# Patient Record
Sex: Female | Born: 1995 | Race: White | Hispanic: No | Marital: Single | Smoking: Never smoker
Health system: Southern US, Community
[De-identification: ages and names within clinical notes are randomized; demographics above are authoritative.]

## PROBLEM LIST (undated history)

## (undated) DIAGNOSIS — N83209 Unspecified ovarian cyst, unspecified side: Secondary | ICD-10-CM

## (undated) HISTORY — PX: APPENDECTOMY: SHX54

## (undated) HISTORY — DX: Unspecified ovarian cyst, unspecified side: N83.209

---
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
## 2013-11-20 ENCOUNTER — Encounter: Payer: Self-pay | Admitting: General Practice

## 2013-11-20 ENCOUNTER — Other Ambulatory Visit: Payer: Self-pay | Admitting: General Practice

## 2013-11-20 ENCOUNTER — Ambulatory Visit (INDEPENDENT_AMBULATORY_CARE_PROVIDER_SITE_OTHER): Payer: BC Managed Care – PPO | Admitting: General Practice

## 2013-11-20 VITALS — BP 110/68 | HR 92 | Temp 97.8°F | Ht 67.0 in | Wt 168.0 lb

## 2013-11-20 DIAGNOSIS — J0100 Acute maxillary sinusitis, unspecified: Secondary | ICD-10-CM

## 2013-11-20 MED ORDER — AZITHROMYCIN 250 MG PO TABS: ORAL_TABLET | ORAL | Status: DC

## 2013-11-20 NOTE — Progress Notes (Signed)
   Subjective:    Patient ID: Valerie Baldwin, female    DOB: 08/13/1996, 18 y.o.   MRN: [id number]  Sinusitis This is a new problem. The current episode started in the past 7 days. The problem is unchanged. There has been no fever. Associated symptoms include congestion, coughing and sinus pressure. Pertinent negatives include no chills, shortness of breath or sore throat. Past treatments include oral decongestants. The treatment provided mild relief.      Review of Systems  Constitutional: Negative for fever and chills.  HENT: Positive for congestion, postnasal drip and sinus pressure. Negative for sore throat.   Respiratory: Positive for cough. Negative for chest tightness and shortness of breath.   Cardiovascular: Negative for chest pain and palpitations.       Objective:   Physical Exam  Constitutional: She is oriented to person, place, and time. She appears well-developed and well-nourished.  HENT:  Head: Normocephalic and atraumatic.  Right Ear: External ear normal.  Left Ear: External ear normal.  Nose: Right sinus exhibits maxillary sinus tenderness. Left sinus exhibits maxillary sinus tenderness.  Mouth/Throat: Posterior oropharyngeal erythema present.  Cardiovascular: Normal rate, regular rhythm and normal heart sounds.   Pulmonary/Chest: Effort normal and breath sounds normal. No respiratory distress. She exhibits no tenderness.  Neurological: She is alert and oriented to person, place, and time.  Skin: Skin is warm and dry.  Psychiatric: She has a normal mood and affect.          Assessment & Plan:  1. Sinusitis, acute maxillary - azithromycin (ZITHROMAX) 250 MG tablet; Take as directed  Dispense: 6 tablet; Refill: 0 -increase fluids -RTO if symptoms worsen or unresolved Patient verbalized understanding Coralie KeensMae E. Avyn Coate, FNP-C

## 2013-11-20 NOTE — Patient Instructions (Signed)

## 2014-05-15 ENCOUNTER — Ambulatory Visit (INDEPENDENT_AMBULATORY_CARE_PROVIDER_SITE_OTHER): Payer: BC Managed Care – PPO | Admitting: Nurse Practitioner

## 2014-05-15 ENCOUNTER — Encounter: Payer: Self-pay | Admitting: Nurse Practitioner

## 2014-05-15 VITALS — BP 111/82 | HR 79 | Temp 98.2°F | Ht 67.0 in | Wt 175.2 lb

## 2014-05-15 DIAGNOSIS — B029 Zoster without complications: Secondary | ICD-10-CM

## 2014-05-15 MED ORDER — VALACYCLOVIR HCL 1 G PO TABS: 1000.0000 mg | ORAL_TABLET | Freq: Three times a day (TID) | ORAL | Status: DC

## 2014-05-15 NOTE — Patient Instructions (Signed)

## 2014-05-15 NOTE — Progress Notes (Signed)
   Subjective:    Patient ID: Valerie Baldwin, female    DOB: 07/10/1996, 18 y.o.   MRN: [id number]  HPI Patient is here with patchy vesicular lesions of the right posterior ankle. She denies tenderness and itching. She noticed them yesterday. She has applied neosporin ointment.    Review of Systems  Constitutional: Negative.   HENT: Negative.   Eyes: Negative.   Respiratory: Negative.   Cardiovascular: Negative.   Gastrointestinal: Negative.   Endocrine: Negative.   Genitourinary: Negative.   Neurological: Negative.   Hematological: Negative.        Objective:   Physical Exam  Constitutional: She is oriented to person, place, and time. She appears well-developed and well-nourished.  HENT:  Head: Normocephalic.  Right Ear: Hearing, tympanic membrane, external ear and ear canal normal.  Left Ear: Hearing, tympanic membrane, external ear and ear canal normal.  Nose: Nose normal.  Mouth/Throat: Uvula is midline, oropharynx is clear and moist and mucous membranes are normal.  Eyes: Conjunctivae and EOM are normal. Pupils are equal, round, and reactive to light.  Neck: Normal range of motion. Neck supple. No JVD present. No thyromegaly present.  Cardiovascular: Normal rate, normal heart sounds and intact distal pulses.   No murmur heard. Pulmonary/Chest: Effort normal and breath sounds normal. She has no wheezes. She has no rales.  Abdominal: Soft. Bowel sounds are normal. She exhibits no mass.  Musculoskeletal: Normal range of motion.  Neurological: She is alert and oriented to person, place, and time. She has normal reflexes.  Skin: Skin is warm and dry.  Patchy vesicular lesion on right posterior ankle.   Psychiatric: She has a normal mood and affect. Her behavior is normal. Judgment and thought content normal.     BP 111/82  Pulse 79  Temp(Src) 98.2 F (36.8 C) (Oral)  Ht 5\' 7"  (1.702 m)  Wt 175 lb 3.2 oz (79.47 kg)  BMI 27.43 kg/m2      Assessment & Plan:   1.  Shingles rash  - valACYclovir (VALTREX) 1000 MG tablet; Take 1 tablet (1,000 mg total) by mouth 3 (three) times daily.  Dispense: 21 tablet; Refill: 0  Cool compress Avoid scratching Motrin or tylenol for pain  RTO prn   Mary-Margaret Daphine DeutscherMartin, FNP

## 2014-05-20 ENCOUNTER — Telehealth: Payer: Self-pay | Admitting: Nurse Practitioner

## 2014-05-20 NOTE — Telephone Encounter (Signed)
Patient aware.

## 2014-05-20 NOTE — Telephone Encounter (Signed)
If it is on the other side then it is probably nit shingles- I woud just watch for now and see if goes away- Not sure what it could be from.

## 2014-05-22 ENCOUNTER — Ambulatory Visit (INDEPENDENT_AMBULATORY_CARE_PROVIDER_SITE_OTHER): Payer: BC Managed Care – PPO | Admitting: Podiatry

## 2014-05-22 ENCOUNTER — Encounter: Payer: Self-pay | Admitting: Podiatry

## 2014-05-22 VITALS — BP 98/61 | HR 87 | Resp 16 | Ht 67.0 in | Wt 170.0 lb

## 2014-05-22 DIAGNOSIS — L600 Ingrowing nail: Secondary | ICD-10-CM

## 2014-05-22 DIAGNOSIS — L03039 Cellulitis of unspecified toe: Secondary | ICD-10-CM

## 2014-05-22 DIAGNOSIS — L03032 Cellulitis of left toe: Secondary | ICD-10-CM

## 2014-05-22 MED ORDER — CEPHALEXIN 500 MG PO CAPS: 500.0000 mg | ORAL_CAPSULE | Freq: Three times a day (TID) | ORAL | Status: DC

## 2014-05-22 MED ORDER — NEOMYCIN-POLYMYXIN-HC 1 % OT SOLN: OTIC | Status: DC

## 2014-05-22 NOTE — Progress Notes (Signed)
   Subjective:    Patient ID: Valerie Baldwin, female    DOB: 09/15/1996, 18 y.o.   MRN: [id number]  HPI Comments: "I have an ingrown toenail"  Patient c/o tender 1st toe left, both borders, for few weeks. The area is red and swollen, some drainage. She has been soaking, but not improved that much.  Toe Pain       Review of Systems  Skin: Positive for rash.  All other systems reviewed and are negative.      Objective:   Physical Exam: I have reviewed her past medical history medications allergies surgeries social history and review of systems. Pulses are strongly palpable. Neurologic sensorium is intact per since once the monofilament. Deep tendon reflexes are intact bilateral muscle strength is 5 over 5 dorsiflexors plantar flexors inverters everters all intrinsic musculature is intact. Orthopedic evaluation demonstrates all joints distal to the ankle a full range of motion without crepitation. Cutaneous evaluation demonstrates supple well hydrated cutis with exception of sharp incurvated nail margin along the tibial border of the hallux left. This doesn't demonstrate gross granulation tissue malodor. Months. She also has what appears to be a violaceous vesicular eruption to the posterior aspect of the right foot and the anterior aspect of the left foot. This does appear to be an atopic dermatitis.        Assessment & Plan:  Assessment: Ingrown toenail tibial border hallux left. Atopic dermatitis bilateral.  Plan: Discussed etiology pathology conservative versus surgical therapies. Chemical matrixectomy was performed today after local anesthetic was injected about the left hallux. She tolerated procedure well and I will followup with her in one week. She was dispensed both oral and written home-going instructions for the care of and soaking of her toes she understands this and is amenable to it.

## 2014-05-22 NOTE — Patient Instructions (Signed)

## 2014-06-03 ENCOUNTER — Encounter: Payer: Self-pay | Admitting: Podiatry

## 2014-06-03 ENCOUNTER — Ambulatory Visit (INDEPENDENT_AMBULATORY_CARE_PROVIDER_SITE_OTHER): Payer: BC Managed Care – PPO | Admitting: Podiatry

## 2014-06-03 VITALS — BP 106/64 | HR 80 | Resp 16

## 2014-06-03 DIAGNOSIS — L600 Ingrowing nail: Secondary | ICD-10-CM

## 2014-06-03 NOTE — Progress Notes (Signed)
She presents today one week status post matrixectomy hallux left. She denies fever chills nausea vomiting muscle aches and pains. Continues to soak twice daily in Betadine and warm water. She continues to Cortisporin Otic drops regularly.  Objective: Vital signs are stable she is alert and oriented x3 there is no erythema edema cellulitis drainage or odor to the hallux left. The matrixectomy site appears to be healing quite nicely.  Assessment: Well-healing matrixectomy hallux left.  Plan: Discontinue Betadine start with Epsom salts warm water soaks continue Cortisporin Otic twice daily cover during the day and leave open at night. Continue to soak and to completely resolved.

## 2014-07-08 ENCOUNTER — Ambulatory Visit (INDEPENDENT_AMBULATORY_CARE_PROVIDER_SITE_OTHER): Payer: BC Managed Care – PPO | Admitting: Podiatry

## 2014-07-08 ENCOUNTER — Encounter: Payer: Self-pay | Admitting: Podiatry

## 2014-07-08 VITALS — BP 118/60 | HR 87 | Resp 12

## 2014-07-08 DIAGNOSIS — L600 Ingrowing nail: Secondary | ICD-10-CM

## 2014-07-08 MED ORDER — NEOMYCIN-POLYMYXIN-HC 1 % OT SOLN: OTIC | Status: DC

## 2014-07-08 NOTE — Progress Notes (Signed)
She presents today with a chief complaint of painful ingrown toenails to the fibular border of the hallux bilaterally. The right foot is a new ingrown toenail in the left foot is one that we have previously worked on the never did heal completely. She denies fever chills nausea vomiting muscle aches pain has tried soaking to no avail.  Objective: Vital signs are stable she is alert and oriented x3. A sharp incurvated nail margins along the fibular border of the hallux right it appears that she has a small spicule to the hallux left. Both have developed. Months cellulitis and malodor.  Assessment: Paronychia ingrown nail abscess hallux bilateral fibular border.  Plan: Discussed etiology pathology conservative versus surgical therapies. Chemical matrixectomy performed to the fibular border of the hallux bilaterally. She tolerated this procedure well after local anesthetic was administered. I will followup with her in one week. She was given both oral and written home-going instructions for the care of her feet.

## 2014-07-15 ENCOUNTER — Ambulatory Visit: Payer: BC Managed Care – PPO | Admitting: Podiatry

## 2015-03-09 ENCOUNTER — Ambulatory Visit: Payer: Self-pay | Admitting: Physician Assistant

## 2015-07-10 ENCOUNTER — Emergency Department (HOSPITAL_BASED_OUTPATIENT_CLINIC_OR_DEPARTMENT_OTHER)
Admission: EM | Admit: 2015-07-10 | Discharge: 2015-07-11 | Disposition: A | Discharge status: Home / Self Care | Payer: BLUE CROSS/BLUE SHIELD | Attending: Emergency Medicine | Admitting: Emergency Medicine

## 2015-07-10 ENCOUNTER — Encounter (HOSPITAL_BASED_OUTPATIENT_CLINIC_OR_DEPARTMENT_OTHER): Payer: Self-pay

## 2015-07-10 DIAGNOSIS — N8320 Unspecified ovarian cysts: Secondary | ICD-10-CM | POA: Diagnosis not present

## 2015-07-10 DIAGNOSIS — R102 Pelvic and perineal pain: Secondary | ICD-10-CM

## 2015-07-10 DIAGNOSIS — R1031 Right lower quadrant pain: Secondary | ICD-10-CM | POA: Diagnosis present

## 2015-07-10 LAB — PREGNANCY, URINE: Preg Test, Ur: NEGATIVE

## 2015-07-10 LAB — URINALYSIS, ROUTINE W REFLEX MICROSCOPIC
BILIRUBIN URINE: NEGATIVE
GLUCOSE, UA: NEGATIVE mg/dL
HGB URINE DIPSTICK: NEGATIVE
Ketones, ur: NEGATIVE mg/dL
Leukocytes, UA: NEGATIVE
NITRITE: NEGATIVE
PH: 8 (ref 5.0–8.0)
Protein, ur: NEGATIVE mg/dL
SPECIFIC GRAVITY, URINE: 1.018 (ref 1.005–1.030)
Urobilinogen, UA: 0.2 mg/dL (ref 0.0–1.0)

## 2015-07-10 NOTE — ED Notes (Addendum)
Pt c/o lower abdominal cramping that radiates to the right since 2030.  She denies dysuria, denies vaginal discharge, no n/v/d, no appendix either.

## 2015-07-11 ENCOUNTER — Ambulatory Visit (HOSPITAL_BASED_OUTPATIENT_CLINIC_OR_DEPARTMENT_OTHER)
Admission: RE | Admit: 2015-07-11 | Discharge: 2015-07-11 | Disposition: A | Discharge status: Home / Self Care | Payer: BLUE CROSS/BLUE SHIELD | Source: Ambulatory Visit | Attending: Emergency Medicine | Admitting: Emergency Medicine

## 2015-07-11 ENCOUNTER — Emergency Department (HOSPITAL_BASED_OUTPATIENT_CLINIC_OR_DEPARTMENT_OTHER)
Admission: EM | Admit: 2015-07-11 | Discharge: 2015-07-11 | Disposition: A | Discharge status: Home / Self Care | Payer: BLUE CROSS/BLUE SHIELD | Source: Home / Self Care | Attending: Emergency Medicine | Admitting: Emergency Medicine

## 2015-07-11 ENCOUNTER — Encounter (HOSPITAL_BASED_OUTPATIENT_CLINIC_OR_DEPARTMENT_OTHER): Payer: Self-pay | Admitting: Emergency Medicine

## 2015-07-11 DIAGNOSIS — N83209 Unspecified ovarian cyst, unspecified side: Secondary | ICD-10-CM

## 2015-07-11 DIAGNOSIS — R1031 Right lower quadrant pain: Secondary | ICD-10-CM

## 2015-07-11 DIAGNOSIS — R102 Pelvic and perineal pain: Secondary | ICD-10-CM

## 2015-07-11 DIAGNOSIS — R935 Abnormal findings on diagnostic imaging of other abdominal regions, including retroperitoneum: Secondary | ICD-10-CM

## 2015-07-11 LAB — WET PREP, GENITAL
CLUE CELLS WET PREP: NONE SEEN
Trich, Wet Prep: NONE SEEN
Yeast Wet Prep HPF POC: NONE SEEN

## 2015-07-11 LAB — CBC WITH DIFFERENTIAL/PLATELET
BASOS ABS: 0 10*3/uL (ref 0.0–0.1)
BASOS PCT: 0 %
EOS PCT: 3 %
Eosinophils Absolute: 0.3 10*3/uL (ref 0.0–0.7)
HEMATOCRIT: 43 % (ref 36.0–46.0)
Hemoglobin: 14.2 g/dL (ref 12.0–15.0)
Lymphocytes Relative: 25 %
Lymphs Abs: 2.6 10*3/uL (ref 0.7–4.0)
MCH: 30.7 pg (ref 26.0–34.0)
MCHC: 33 g/dL (ref 30.0–36.0)
MCV: 93.1 fL (ref 78.0–100.0)
MONO ABS: 0.8 10*3/uL (ref 0.1–1.0)
Monocytes Relative: 8 %
NEUTROS ABS: 6.5 10*3/uL (ref 1.7–7.7)
Neutrophils Relative %: 64 %
PLATELETS: 268 10*3/uL (ref 150–400)
RBC: 4.62 MIL/uL (ref 3.87–5.11)
RDW: 12.7 % (ref 11.5–15.5)
WBC: 10.2 10*3/uL (ref 4.0–10.5)

## 2015-07-11 MED ORDER — HYDROCODONE-ACETAMINOPHEN 5-325 MG PO TABS: 1.0000 | ORAL_TABLET | Freq: Four times a day (QID) | ORAL | Status: DC | PRN

## 2015-07-11 MED ORDER — HYDROCODONE-ACETAMINOPHEN 5-325 MG PO TABS
1.0000 | ORAL_TABLET | Freq: Once | ORAL | Status: AC
  Administered 2015-07-11: 1 via ORAL
  Filled 2015-07-11: qty 1

## 2015-07-11 MED ORDER — ONDANSETRON 8 MG PO TBDP
8.0000 mg | ORAL_TABLET | Freq: Once | ORAL | Status: AC
  Administered 2015-07-11: 8 mg via ORAL
  Filled 2015-07-11: qty 1

## 2015-07-11 NOTE — ED Provider Notes (Signed)
CSN: [id number]     Arrival date & time 07/10/15  2224 History   First MD Initiated Contact with Patient 07/11/15 0111     Chief Complaint  Patient presents with  . Abdominal Pain     (Consider location/radiation/quality/duration/timing/severity/associated sxs/prior Treatment) HPI  This is a 19 year old female who developed right pelvic pain during intercourse about 8 PM yesterday evening. She was unable to complete intercourse due to the pain. She rates the pain as a 5 out of 10 while lying still. It is worse with movement or palpation. There is been no associated fever, chills, vomiting, diarrhea, vaginal bleeding, vaginal discharge, dysuria or hematuria. She has had nausea which she attributes to pain. The pain is described as dull and crampy. It is present constantly but waxes and wanes. She is status post appendectomy.  History reviewed. No pertinent past medical history. Past Surgical History  Procedure Laterality Date  . Appendectomy     No family history on file. Social History  Substance Use Topics  . Smoking status: Never Smoker   . Smokeless tobacco: None  . Alcohol Use: No   OB History    No data available     Review of Systems  All other systems reviewed and are negative.   Allergies  Review of patient's allergies indicates no known allergies.  Home Medications   Prior to Admission medications   Medication Sig Start Date End Date Taking? Authorizing Provider  cephALEXin (KEFLEX) 500 MG capsule Take 1 capsule (500 mg total) by mouth 3 (three) times daily. 05/22/14   Max T Hyatt, DPM  NEOMYCIN-POLYMYXIN-HYDROCORTISONE (CORTISPORIN) 1 % SOLN otic solution Apply 1-2 drops to the toe after soaking BID 07/08/14   Max T Hyatt, DPM  valACYclovir (VALTREX) 1000 MG tablet Take 1 tablet (1,000 mg total) by mouth 3 (three) times daily. 05/15/14   Mary-Margaret Daphine Deutscher, FNP   BP 109/55 mmHg  Pulse 67  Temp(Src) 98.5 F (36.9 C) (Oral)  Resp 18  Ht  (1.702 m)  Wt 160  lb (72.576 kg)  BMI 25.05 kg/m2  SpO2 98%  LMP 06/19/2015 (Approximate)   Physical Exam General: Well-developed, well-nourished female in no acute distress; appearance consistent with age of record HENT: normocephalic; atraumatic Eyes: pupils equal, round and reactive to light; extraocular muscles intact Neck: supple Heart: regular rate and rhythm Lungs: clear to auscultation bilaterally Abdomen: soft; nondistended; suprapubic tenderness more prominent on the right; no masses or hepatosplenomegaly; bowel sounds present GU: Normal external genitalia; no vaginal bleeding; no vaginal discharge; right adnexal tenderness without definite mass palpated Extremities: No deformity; full range of motion; pulses normal Neurologic: Awake, alert and oriented; motor function intact in all extremities and symmetric; no facial droop Skin: Warm and dry Psychiatric: Normal mood and affect    ED Course  Procedures (including critical care time)   MDM   Nursing notes and vitals signs, including pulse oximetry, reviewed.  Summary of this visit's results, reviewed by myself:  Labs:  Results for orders placed or performed during the hospital encounter of 07/10/15 (from the past 24 hour(s))  Urinalysis, Routine w reflex microscopic (not at [facility])     Status: Abnormal   Collection Time: 07/10/15 10:40 PM  Result Value Ref Range   Color, Urine YELLOW YELLOW   APPearance CLOUDY (A) CLEAR   Specific Gravity, Urine 1.018 1.005 - 1.030   pH 8.0 5.0 - 8.0   Glucose, UA NEGATIVE NEGATIVE mg/dL   Hgb urine dipstick NEGATIVE NEGATIVE  Bilirubin Urine NEGATIVE NEGATIVE   Ketones, ur NEGATIVE NEGATIVE mg/dL   Protein, ur NEGATIVE NEGATIVE mg/dL   Urobilinogen, UA 0.2 0.0 - 1.0 mg/dL   Nitrite NEGATIVE NEGATIVE   Leukocytes, UA NEGATIVE NEGATIVE  Pregnancy, urine     Status: None   Collection Time: 07/10/15 10:40 PM  Result Value Ref Range   Preg Test, Ur NEGATIVE NEGATIVE  Wet prep, genital      Status: Abnormal   Collection Time: 07/11/15  1:40 AM  Result Value Ref Range   Yeast Wet Prep HPF POC NONE SEEN NONE SEEN   Trich, Wet Prep NONE SEEN NONE SEEN   Clue Cells Wet Prep HPF POC NONE SEEN NONE SEEN   WBC, Wet Prep HPF POC FEW (A) NONE SEEN       Paula Libra, MD 07/11/15 [phone]

## 2015-07-11 NOTE — Discharge Instructions (Signed)
Take vicodin for breakthrough pain, do not drink alcohol, drive, care for children or do other critical tasks while taking vicodin.  He will need a repeat ultrasound in 6-12 weeks, please follow with her OB/GYN for that.  Please follow with your primary care doctor in the next 2 days for a check-up. They must obtain records for further management.   Do not hesitate to return to the Emergency Department for any new, worsening or concerning symptoms.    Ovarian Cyst An ovarian cyst is a fluid-filled sac that forms on an ovary. The ovaries are small organs that produce eggs in women. Various types of cysts can form on the ovaries. Most are not cancerous. Many do not cause problems, and they often go away on their own. Some may cause symptoms and require treatment. Common types of ovarian cysts include:  Functional cysts--These cysts may occur every month during the menstrual cycle. This is normal. The cysts usually go away with the next menstrual cycle if the woman does not get pregnant. Usually, there are no symptoms with a functional cyst.  Endometrioma cysts--These cysts form from the tissue that lines the uterus. They are also called "chocolate cysts" because they become filled with blood that turns brown. This type of cyst can cause pain in the lower abdomen during intercourse and with your menstrual period.  Cystadenoma cysts--This type develops from the cells on the outside of the ovary. These cysts can get very big and cause lower abdomen pain and pain with intercourse. This type of cyst can twist on itself, cut off its blood supply, and cause severe pain. It can also easily rupture and cause a lot of pain.  Dermoid cysts--This type of cyst is sometimes found in both ovaries. These cysts may contain different kinds of body tissue, such as skin, teeth, hair, or cartilage. They usually do not cause symptoms unless they get very big.  Theca lutein cysts--These cysts occur when too much of a  certain hormone (human chorionic gonadotropin) is produced and overstimulates the ovaries to produce an egg. This is most common after procedures used to assist with the conception of a baby (in vitro fertilization). CAUSES   Fertility drugs can cause a condition in which multiple large cysts are formed on the ovaries. This is called ovarian hyperstimulation syndrome.  A condition called polycystic ovary syndrome can cause hormonal imbalances that can lead to nonfunctional ovarian cysts. SIGNS AND SYMPTOMS  Many ovarian cysts do not cause symptoms. If symptoms are present, they may include:  Pelvic pain or pressure.  Pain in the lower abdomen.  Pain during sexual intercourse.  Increasing girth (swelling) of the abdomen.  Abnormal menstrual periods.  Increasing pain with menstrual periods.  Stopping having menstrual periods without being pregnant. DIAGNOSIS  These cysts are commonly found during a routine or annual pelvic exam. Tests may be ordered to find out more about the cyst. These tests may include:  Ultrasound.  X-ray of the pelvis.  CT scan.  MRI.  Blood tests. TREATMENT  Many ovarian cysts go away on their own without treatment. Your health care provider may want to check your cyst regularly for 2-3 months to see if it changes. For women in menopause, it is particularly important to monitor a cyst closely because of the higher rate of ovarian cancer in menopausal women. When treatment is needed, it may include any of the following:  A procedure to drain the cyst (aspiration). This may be done using a long needle  and ultrasound. It can also be done through a laparoscopic procedure. This involves using a thin, lighted tube with a tiny camera on the end (laparoscope) inserted through a small incision.  Surgery to remove the whole cyst. This may be done using laparoscopic surgery or an open surgery involving a larger incision in the lower abdomen.  Hormone treatment or  birth control pills. These methods are sometimes used to help dissolve a cyst. HOME CARE INSTRUCTIONS   Only take over-the-counter or prescription medicines as directed by your health care provider.  Follow up with your health care provider as directed.  Get regular pelvic exams and Pap tests. SEEK MEDICAL CARE IF:   Your periods are late, irregular, or painful, or they stop.  Your pelvic pain or abdominal pain does not go away.  Your abdomen becomes larger or swollen.  You have pressure on your bladder or trouble emptying your bladder completely.  You have pain during sexual intercourse.  You have feelings of fullness, pressure, or discomfort in your stomach.  You lose weight for no apparent reason.  You feel generally ill.  You become constipated.  You lose your appetite.  You develop acne.  You have an increase in body and facial hair.  You are gaining weight, without changing your exercise and eating habits.  You think you are pregnant. SEEK IMMEDIATE MEDICAL CARE IF:   You have increasing abdominal pain.  You feel sick to your stomach (nauseous), and you throw up (vomit).  You develop a fever that comes on suddenly.  You have abdominal pain during a bowel movement.  Your menstrual periods become heavier than usual. MAKE SURE YOU:  Understand these instructions.  Will watch your condition.  Will get help right away if you are not doing well or get worse. Document Released: 10/10/2005 Document Revised: 10/15/2013 Document Reviewed: 06/17/2013 [facility] Patient Information 2015 [place], [place]. This information is not intended to replace advice given to you by your health care provider. Make sure you discuss any questions you have with your health care provider.

## 2015-07-11 NOTE — ED Notes (Signed)
Here during 3rd shift for c/o abdominal pain, today at 1230hrs ultrasound performed. Here for follow up of ultrasound, lab work done

## 2015-07-11 NOTE — ED Notes (Signed)
Pt c/o right lower abdominal pain since yesterday and was seen in ultrasound today, dx'd with ruptured ovarian cyst.  Sent to ED for further evaluation.

## 2015-07-11 NOTE — ED Provider Notes (Signed)
CSN: [id number]     Arrival date & time 07/11/15  1335 History   First MD Initiated Contact with Patient 07/11/15 1435     Chief Complaint  Patient presents with  . Abdominal Pain     (Consider location/radiation/quality/duration/timing/severity/associated sxs/prior Treatment) HPI   Blood pressure 108/64, pulse 57, temperature 98.2 F (36.8 C), temperature source Oral, resp. rate 18, height 5\' 7"  (1.702 m), weight 160 lb (72.576 kg), last menstrual period 06/19/2015, SpO2 100 %.  Makinna Andy is a 19 y.o. female return to ED for evaluation after she received her pelvic ultrasound. Patient was seen for acute onset of severe abdominal pain last night after intercourse. Ultrasound was not available at that time. Patient states that the abdominal pain has been consistent since that time, she has not yet had time to fill her Vicodin prescription. Ultrasound showed a hemorrhagic ovarian cysts with a moderate amount of free fluid in the pelvis. Patient denies lightheadedness, syncope, shortness of breath.   History reviewed. No pertinent past medical history. Past Surgical History  Procedure Laterality Date  . Appendectomy     No family history on file. Social History  Substance Use Topics  . Smoking status: Never Smoker   . Smokeless tobacco: None  . Alcohol Use: No   OB History    No data available     Review of Systems  10 systems reviewed and found to be negative, except as noted in the HPI.   Allergies  Review of patient's allergies indicates no known allergies.  Home Medications   Prior to Admission medications   Medication Sig Start Date End Date Taking? Authorizing Provider  HYDROcodone-acetaminophen (NORCO) 5-325 MG per tablet Take 1-2 tablets by mouth every 6 (six) hours as needed (for pain). 07/11/15   John Molpus, MD   BP 108/64 mmHg  Pulse 57  Temp(Src) 98.2 F (36.8 C) (Oral)  Resp 18  Ht 5\' 7"  (1.702 m)  Wt 160 lb (72.576 kg)  BMI 25.05 kg/m2  SpO2  100%  LMP 06/19/2015 (Approximate) Physical Exam  Constitutional: She is oriented to person, place, and time. She appears well-developed and well-nourished. No distress.  HENT:  Head: Normocephalic and atraumatic.  Mouth/Throat: Oropharynx is clear and moist.  Eyes: Conjunctivae and EOM are normal. Pupils are equal, round, and reactive to light.  Neck: Normal range of motion.  Cardiovascular: Normal rate, regular rhythm and intact distal pulses.   Pulmonary/Chest: Effort normal and breath sounds normal. No stridor.  Abdominal: Soft. There is no tenderness.  Mild tenderness to palpation of the bilateral lower quadrants worse on the right than the left, no guarding or rebound.  Musculoskeletal: Normal range of motion.  Neurological: She is alert and oriented to person, place, and time.  Skin: She is not diaphoretic.  Psychiatric: She has a normal mood and affect.  Nursing note and vitals reviewed.   ED Course  Procedures (including critical care time) Labs Review Labs Reviewed  CBC WITH DIFFERENTIAL/PLATELET    Imaging Review US Transvaginal Non-ob  07/11/2015   ADDENDUM REPORT: 07/11/2015 14:39 ADDENDUM: There is no intrauterine device, per the patient. Electronically Signed   By: Esperanza Heir M.D.   On: 07/11/2015 14:39  07/11/2015   CLINICAL DATA:  Diffuse pelvic pain since this morning, head severe right lower quadrant pain 8 p.m. last night, has intrauterine device  EXAM: TRANSABDOMINAL AND TRANSVAGINAL ULTRASOUND OF PELVIS  TECHNIQUE: Both transabdominal and transvaginal ultrasound examinations of the pelvis were performed. Transabdominal technique was  performed for global imaging of the pelvis including uterus, ovaries, adnexal regions, and pelvic cul-de-sac. It was necessary to proceed with endovaginal exam following the transabdominal exam to visualize the uterus endometrium and ovaries.  COMPARISON:  None  FINDINGS: Uterus  Measurements: 7 x 3 x 3.9 cm. No fibroids or the  identified. Subtle echogenic stripe within the endometrium may represent intrauterine device. It is not very well seen.  Endometrium  Thickness: 4 mm.  No focal abnormality visualized.  Right ovary  Measurements: 35 x 20 x 25 mm. Normal appearance/no adnexal mass.  Left ovary  Measurements: 58 x 19 x 49 mm. There is a complex cystic structure measuring 34 x 17 x 28 mm with numerous internal septations.  Other findings  Moderate volume of echogenic free fluid in the pelvis and left adnexa.  IMPRESSION: Findings suggest left ovarian hemorrhagic cyst associated with complex free presumably hemorrhagic free fluid in the pelvis. Follow up pelvic ultrasound in 6 - 12 weeks is recommended. This recommendation follows the consensus statement: Management of Asymptomatic Ovarian and Other Adnexal Cysts Imaged at [place]: Society of Radiologists in Ultrasound Consensus Conference Statement. Radiology 2010; [phone].  Electronically Signed: By: Esperanza Heir M.D. On: 07/11/2015 13:17   US Pelvis Complete  07/11/2015   ADDENDUM REPORT: 07/11/2015 14:39 ADDENDUM: There is no intrauterine device, per the patient. Electronically Signed   By: Esperanza Heir M.D.   On: 07/11/2015 14:39  07/11/2015   CLINICAL DATA:  Diffuse pelvic pain since this morning, head severe right lower quadrant pain 8 p.m. last night, has intrauterine device  EXAM: TRANSABDOMINAL AND TRANSVAGINAL ULTRASOUND OF PELVIS  TECHNIQUE: Both transabdominal and transvaginal ultrasound examinations of the pelvis were performed. Transabdominal technique was performed for global imaging of the pelvis including uterus, ovaries, adnexal regions, and pelvic cul-de-sac. It was necessary to proceed with endovaginal exam following the transabdominal exam to visualize the uterus endometrium and ovaries.  COMPARISON:  None  FINDINGS: Uterus  Measurements: 7 x 3 x 3.9 cm. No fibroids or the identified. Subtle echogenic stripe within the endometrium may represent  intrauterine device. It is not very well seen.  Endometrium  Thickness: 4 mm.  No focal abnormality visualized.  Right ovary  Measurements: 35 x 20 x 25 mm. Normal appearance/no adnexal mass.  Left ovary  Measurements: 58 x 19 x 49 mm. There is a complex cystic structure measuring 34 x 17 x 28 mm with numerous internal septations.  Other findings  Moderate volume of echogenic free fluid in the pelvis and left adnexa.  IMPRESSION: Findings suggest left ovarian hemorrhagic cyst associated with complex free presumably hemorrhagic free fluid in the pelvis. Follow up pelvic ultrasound in 6 - 12 weeks is recommended. This recommendation follows the consensus statement: Management of Asymptomatic Ovarian and Other Adnexal Cysts Imaged at [place]: Society of Radiologists in Ultrasound Consensus Conference Statement. Radiology 2010; [phone].  Electronically Signed: By: Esperanza Heir M.D. On: 07/11/2015 13:17   I have personally reviewed and evaluated these images and lab results as part of my medical decision-making.   EKG Interpretation None      MDM   Final diagnoses:  Hemorrhagic ovarian cyst   Filed Vitals:   07/11/15 1340 07/11/15 1509  BP: 110/68 108/64  Pulse: 72 57  Temp: 98.2 F (36.8 C)   TempSrc: Oral   Resp: 18 18  Height: 5\' 7"  (1.702 m)   Weight: 160 lb (72.576 kg)   SpO2: 100% 100%    Medications  HYDROcodone-acetaminophen (NORCO/VICODIN) 5-325 MG per tablet 1 tablet (1 tablet Oral Given 07/11/15 1507)    Courteney Alderete is a pleasant 19 y.o. female presenting with acute onset of right lower quadrant pain after intercourse last night. Ultrasound shows a hemorrhagic cyst with a moderate amount of free fluid in the abdomen. Abdominal exam remains nonsurgical, there is no anemia seen on CBC. Encouraged patient to follow closely with OB/GYN and advised her that she will need a repeat ultrasound in 6-12 weeks to check for resolution. With had an extensive discussion of return  precautions patient verbalizes her understanding.  Evaluation does not show pathology that would require ongoing emergent intervention or inpatient treatment. Pt is hemodynamically stable and mentating appropriately. Discussed findings and plan with patient/guardian, who agrees with care plan. All questions answered. Return precautions discussed and outpatient follow up given.        Wynetta Emery, PA-C 07/12/15 0140  Vanetta Mulders, MD 07/15/15 2104

## 2015-07-11 NOTE — Discharge Instructions (Signed)
Your ultrasound today shows a hemorrhagic ovarian cyst. You will need a repeat ultrasound in 6-12 weeks. Please follow with your OB/GYN. Do not hesitate to return to the emergency room for any new, worsening or concerning symptoms.

## 2015-07-13 LAB — GC/CHLAMYDIA PROBE AMP (~~LOC~~) NOT AT ARMC
CHLAMYDIA, DNA PROBE: NEGATIVE
NEISSERIA GONORRHEA: NEGATIVE

## 2015-07-14 ENCOUNTER — Encounter: Payer: Self-pay | Admitting: Women's Health

## 2015-07-14 ENCOUNTER — Ambulatory Visit (INDEPENDENT_AMBULATORY_CARE_PROVIDER_SITE_OTHER): Payer: BLUE CROSS/BLUE SHIELD | Admitting: Women's Health

## 2015-07-14 VITALS — BP 102/60 | HR 64 | Wt 163.0 lb

## 2015-07-14 DIAGNOSIS — R1031 Right lower quadrant pain: Secondary | ICD-10-CM | POA: Diagnosis not present

## 2015-07-14 NOTE — Progress Notes (Signed)
Patient ID: Hildred Pharo, female   DOB: 01/16/1996, 19 y.o.   MRN: [id number]   [facility] Visit  Patient name: Valerie Baldwin MRN [id number]  Date of birth: [birth year]  CC & HPI:  Valerie Baldwin is a 19 y.o. G0 Caucasian female presenting today for f/u after ED visit Friday night 9/16 into early Saturday morning 9/17. Reported sudden onset severe RLQ pain after sex Friday night.  Has never experienced this before. Denies n/v/d, fever/chills, uti s/s, bowel changes. Does not have appendix. Has Nexplanon, has occ spotting w/ it. No bleeding since this episode on Friday night. Pain has improved some, taking percocet rx prn which helps.  Had neg UPT, neg gc/ct, neg wet prep, and normal cbc w/ wbc 10.2, H/H 14.2/43.  Had pelvic u/s which revealed normal uterus, normal Rt ovary, Lt ovary w/ 34x17x43mm complex cystic structure w/ numerous internal septations suggestive of hemorrhagic cyst, also w/ moderate volume echogenic free fluid in pelvis and Lt adnexae.  Of note, [place] report states pt has IUD and there was a subtle echogenic stripe w/in endometrium which they thought could be IUD but was not well seen- pt does not have IUD- has nexplanon.   Patient's last menstrual period was 06/19/2015 (approximate). The current method of family planning is nexplanon. Last pap never, needs screening pap  Pertinent History Reviewed:  Medical & Surgical Hx:   History reviewed. No pertinent past medical history. Past Surgical History  Procedure Laterality Date  . Appendectomy     Medications: Reviewed & Updated - see associated section Social History: Reviewed -  reports that she has never smoked. She does not have any smokeless tobacco history on file.  Objective Findings:  Vitals: BP 102/60 mmHg  Pulse 64  Wt 163 lb (73.936 kg)  LMP 06/19/2015 (Approximate) Body mass index is 25.52 kg/(m^2).  Physical Examination: General appearance - alert, well appearing, and in no distress Chest - clear  to auscultation, no wheezes, rales or rhonchi, symmetric air entry Heart - normal rate and regular rhythm Abdomen - soft, nondistended, no organomegaly or masses, +diffuse lower abd tenderness Pelvic - VULVA: normal appearing vulva with no masses, tenderness or lesions, VAGINA: normal appearing vagina, small amount menstrual colored blood in vault, nonodorous, CERVIX: neg CMT, normal appearing cervix without discharge or lesions, UTERUS: normal size, shape, consistency, mild tenderness, ADNEXA: no masses palpated, mild tenderness bilaterally  Assessment & Plan:  A:   RLQ pain of unknown etiology  Lt ovarian hemorrhagic cyst w/ free fluid in pelvis  P:  Discussed w/ [facility], recommended repeating pelvic u/s in 3 months  Return in about 3 months (around 10/13/2015) for pelvic u/s first then pap & physical w/ me.  Marge Duncans CNM, [facility] 07/14/2015 11:24 AM

## 2015-08-27 ENCOUNTER — Encounter: Payer: Self-pay | Admitting: Pediatrics

## 2015-08-27 ENCOUNTER — Ambulatory Visit (INDEPENDENT_AMBULATORY_CARE_PROVIDER_SITE_OTHER): Payer: BLUE CROSS/BLUE SHIELD | Admitting: Pediatrics

## 2015-08-27 VITALS — Temp 98.5°F | Ht 67.0 in | Wt 163.2 lb

## 2015-08-27 DIAGNOSIS — R1084 Generalized abdominal pain: Secondary | ICD-10-CM

## 2015-08-27 DIAGNOSIS — R5383 Other fatigue: Secondary | ICD-10-CM

## 2015-08-27 NOTE — Progress Notes (Signed)
    Subjective:    Patient ID: Valerie Baldwin, female    DOB: 04-02-1996, 19 y.o.   MRN: [id number]  CC: fatigue  HPI: Valerie Baldwin is a 19 y.o. female presenting on 08/27/2015 for Fatigue Here for fatigue. Muscles feel fatigued, skips crossfit when she feels tired. Months has been having fatigue Some days worse than others Nexplanon in arm, spots at times No fevers, sore throat, no weight changes Appetite has been fine, says she feels hungry most of the time Normal stooling No rashes  Nannying and in school online, studying philosphy and doing paralegal  In bed at 9:30pm, get up at 515am, to cross fit, to work by [employer] off  Fam hx: PGM with breast cancer, paternal great aunt with breast cancer   Relevant past medical, surgical, family and social history reviewed and updated as indicated. Interim medical history since our last visit reviewed. Allergies and medications reviewed and updated.   ROS: Per HPI unless specifically indicated above  Past Medical History Patient Active Problem List   Diagnosis Date Noted  . Right lower quadrant pain 07/14/2015  . Shingles rash 05/15/2014    No current outpatient prescriptions on file.   No current facility-administered medications for this visit.       Objective:    Temp(Src) 98.5 F (36.9 C) (Oral)  Ht 5' 7" (1.702 m)  Wt 163 lb 3.2 oz (74.027 kg)  BMI 25.55 kg/m2  Wt Readings from Last 3 Encounters:  08/27/15 163 lb 3.2 oz (74.027 kg)  07/14/15 163 lb (73.936 kg)  07/11/15 160 lb (72.576 kg)    Gen: NAD, alert, cooperative with exam, NCAT EYES: EOMI, no scleral injection or icterus ENT:  TMs pearly gray b/l, OP without erythema LYMPH: no cervical LAD CV: NRRR, normal S1/S2, no murmur, distal pulses 2+ b/l Resp: CTABL, no wheezes, normal WOB Abd: +BS, soft, NTND. no guarding or organomegaly Ext: No edema, warm Neuro: Alert and oriented, strength equal b/l UE and LE, coordination grossly normal MSK:  normal muscle bulk Psych: engaging, full affect, mood "good"     Assessment & Plan:   Dorothyann was seen today for fatigue ongoing for several months. Had a normal CBC within last 6 weeks with gyn. Will check below labs. Encouraged good sleep hygiene. Return precautions given.  Diagnoses and all orders for this visit:  Other fatigue -     Vit D  25 hydroxy (rtn osteoporosis monitoring)  Generalized abdominal pain -     CMP14+EGFR  Follow up plan: Return in about 4 weeks (around 09/24/2015).  Assunta Found, MD [facility] 08/27/2015, 5:20 PM

## 2015-08-28 ENCOUNTER — Ambulatory Visit (INDEPENDENT_AMBULATORY_CARE_PROVIDER_SITE_OTHER): Payer: BLUE CROSS/BLUE SHIELD

## 2015-08-28 ENCOUNTER — Other Ambulatory Visit: Payer: Self-pay | Admitting: Women's Health

## 2015-08-28 DIAGNOSIS — R102 Pelvic and perineal pain: Secondary | ICD-10-CM | POA: Diagnosis not present

## 2015-08-28 DIAGNOSIS — N854 Malposition of uterus: Secondary | ICD-10-CM

## 2015-08-28 DIAGNOSIS — N83201 Unspecified ovarian cyst, right side: Secondary | ICD-10-CM | POA: Diagnosis not present

## 2015-08-28 NOTE — Progress Notes (Signed)
PELVIC US TA/TV:normal anteverted uterus,EEC 1.2 mm,normal lt ov, rt ov contains a  3.5 x 2.8 x 2.4cm hemorrhagic cyst,simple cul de sac fluid,Dr Eure spoke to pt about ultrasound results and will make a f/u appt.

## 2015-10-13 ENCOUNTER — Other Ambulatory Visit: Payer: BLUE CROSS/BLUE SHIELD

## 2015-10-20 ENCOUNTER — Other Ambulatory Visit: Payer: BLUE CROSS/BLUE SHIELD | Admitting: Women's Health

## 2015-10-25 HISTORY — PX: WISDOM TOOTH EXTRACTION: SHX21

## 2015-11-04 ENCOUNTER — Other Ambulatory Visit: Payer: BLUE CROSS/BLUE SHIELD | Admitting: Women's Health

## 2015-11-16 ENCOUNTER — Ambulatory Visit (INDEPENDENT_AMBULATORY_CARE_PROVIDER_SITE_OTHER): Payer: BLUE CROSS/BLUE SHIELD | Admitting: Women's Health

## 2015-11-16 ENCOUNTER — Encounter: Payer: Self-pay | Admitting: Women's Health

## 2015-11-16 ENCOUNTER — Other Ambulatory Visit (HOSPITAL_COMMUNITY)
Admission: RE | Admit: 2015-11-16 | Discharge: 2015-11-16 | Disposition: A | Discharge status: Home / Self Care | Payer: BLUE CROSS/BLUE SHIELD | Source: Ambulatory Visit | Attending: Obstetrics & Gynecology | Admitting: Obstetrics & Gynecology

## 2015-11-16 VITALS — BP 100/60 | HR 60 | Wt 158.0 lb

## 2015-11-16 DIAGNOSIS — Z01419 Encounter for gynecological examination (general) (routine) without abnormal findings: Secondary | ICD-10-CM | POA: Diagnosis not present

## 2015-11-16 DIAGNOSIS — Z113 Encounter for screening for infections with a predominantly sexual mode of transmission: Secondary | ICD-10-CM | POA: Diagnosis present

## 2015-11-16 DIAGNOSIS — N83209 Unspecified ovarian cyst, unspecified side: Secondary | ICD-10-CM | POA: Insufficient documentation

## 2015-11-16 DIAGNOSIS — N83202 Unspecified ovarian cyst, left side: Secondary | ICD-10-CM

## 2015-11-16 DIAGNOSIS — N83201 Unspecified ovarian cyst, right side: Secondary | ICD-10-CM

## 2015-11-16 NOTE — Progress Notes (Signed)
Patient ID: Valerie Baldwin, female   DOB: 1996-10-20, 20 y.o.   MRN: [id number] Subjective:   Valerie Baldwin is a 20 y.o. G0 Caucasian female here for a routine well-woman exam.  No LMP recorded. Patient has had an implant.   Nexplanon due to be removed in Feb- wants a new one placed at that time. Saw pt in Sept for pelvic pain- had Lt hemorrhagic cyst, f/u in Nov w/ u/s- revealed Lt hemorrhagic cyst had resolved and had new Rt 3.5x2.8x2.4cm hemorrhagid cyst and discussed w/ Dr. Despina Hidden after u/s. Denies any current pelvic pain.  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  Past Medical History Past Medical History  Diagnosis Date  . Ruptured cyst of ovary     Past Surgical History Past Surgical History  Procedure Laterality Date  . Appendectomy      Gynecologic History No obstetric history on file.  No LMP recorded. Patient has had an implant. Contraception: Nexplanon Last Pap: never. Results were: n/a Last mammogram: never. Results were: n/a Last TCS: never  Obstetric History OB History  No data available    Current Medications No current outpatient prescriptions on file prior to visit.   No current facility-administered medications on file prior to visit.    Review of Systems Patient denies any headaches, blurred vision, shortness of breath, chest pain, abdominal pain, problems with bowel movements, urination, or  intercourse.  Objective:  BP 100/60 mmHg  Pulse 60  Wt 158 lb (71.668 kg) Physical Exam  General:  Well developed, well nourished, no acute distress. She is alert and oriented x3. Skin:  Warm and dry Neck:  Midline trachea, no thyromegaly or nodules Cardiovascular: Regular rate and rhythm, no murmur heard Lungs:  Effort normal, all lung fields clear to auscultation bilaterally Breasts:  No dominant palpable mass, retraction, or nipple discharge Abdomen:  Soft, non tender, no hepatosplenomegaly or masses Pelvic:  External genitalia is normal in appearance.  The vagina is normal in appearance. The cervix is bulbous, no CMT.  Thin prep pap is done w/ reflex HR HPV cotesting. Uterus is felt to be normal size, shape, and contour.  No adnexal masses noted. Some uterine/Rt adnexal tenderness w/ exam.  Extremities:  No swelling or varicosities noted Psych:  She has a normal mood and affect  Assessment:   Healthy well-woman exam Recent Rt hemorrhagic ovarian cyst Due for nexplanon removal/reinsert  Plan:  F/U ~2/6 for pelvic u/s to reassess Rt ovarian cyst, then 2/7 for nexplanon removal/reinsert, or sooner if needed Mammogram  or sooner if problems Colonoscopy  or sooner if problems  Marge Duncans CNM, [facility] 11/16/2015 10:54 AM

## 2015-11-17 LAB — CYTOLOGY - PAP

## 2015-11-30 ENCOUNTER — Ambulatory Visit: Payer: BLUE CROSS/BLUE SHIELD

## 2015-12-07 ENCOUNTER — Other Ambulatory Visit: Payer: Self-pay | Admitting: Women's Health

## 2015-12-07 DIAGNOSIS — N83201 Unspecified ovarian cyst, right side: Secondary | ICD-10-CM

## 2015-12-08 ENCOUNTER — Ambulatory Visit (INDEPENDENT_AMBULATORY_CARE_PROVIDER_SITE_OTHER): Payer: BLUE CROSS/BLUE SHIELD

## 2015-12-08 DIAGNOSIS — N83201 Unspecified ovarian cyst, right side: Secondary | ICD-10-CM | POA: Diagnosis not present

## 2015-12-08 DIAGNOSIS — N854 Malposition of uterus: Secondary | ICD-10-CM | POA: Diagnosis not present

## 2015-12-08 NOTE — Progress Notes (Signed)
PELVIC US TA/TV: normal homogenous anteverted uterus,normal ov's bilat (mobile),EEC 1.28mm,small amount of simple cul de sac fluid,no pain during ultrasound

## 2015-12-14 ENCOUNTER — Ambulatory Visit (INDEPENDENT_AMBULATORY_CARE_PROVIDER_SITE_OTHER): Payer: BLUE CROSS/BLUE SHIELD | Admitting: Women's Health

## 2015-12-14 ENCOUNTER — Encounter: Payer: Self-pay | Admitting: Women's Health

## 2015-12-14 VITALS — BP 112/62 | HR 80 | Wt 156.0 lb

## 2015-12-14 DIAGNOSIS — Z3202 Encounter for pregnancy test, result negative: Secondary | ICD-10-CM | POA: Diagnosis not present

## 2015-12-14 DIAGNOSIS — Z30017 Encounter for initial prescription of implantable subdermal contraceptive: Secondary | ICD-10-CM

## 2015-12-14 DIAGNOSIS — Z3046 Encounter for surveillance of implantable subdermal contraceptive: Secondary | ICD-10-CM | POA: Diagnosis not present

## 2015-12-14 LAB — POCT URINE PREGNANCY: PREG TEST UR: NEGATIVE

## 2015-12-14 NOTE — Progress Notes (Signed)
Patient ID: Valerie Baldwin, female   DOB: Jul 28, 1996, 20 y.o.   MRN: [id number] [name] is a 20 y.o. year old G0 Caucasian female here for Nexplanon removal and reinsertion.  She was given informed consent for removal and reinsertion of her Nexplanon. Her Nexplanon was placed 2014, Patient's last menstrual period was 12/07/2015 (exact date)., and her pregnancy test today was neg.   Risks/benefits/side effects of Nexplanon have been discussed and her questions have been answered.  Specifically, a failure rate of 10/998 has been reported, with an increased failure rate if pt takes St. John's Wort and/or antiseizure medicaitons.  Medora Roorda is aware of the common side effect of irregular bleeding, which the incidence of decreases over time.  BP 112/62 mmHg  Pulse 80  Wt 156 lb (70.761 kg)  LMP 12/07/2015 (Exact Date) Patient's last menstrual period was 12/07/2015 (exact date). Results for orders placed or performed in visit on 12/14/15 (from the past 24 hour(s))  POCT urine pregnancy   Collection Time: 12/14/15  9:23 AM  Result Value Ref Range   Preg Test, Ur Negative Negative     Appropriate time out taken. Nexplanon site identified.  Area prepped in usual sterile fashon. Two cc's of 2% lidocaine was used to anesthetize the area. A small stab incision was made right beside the implant on the distal portion.  The Nexplanon rod was grasped using hemostats and removed intact without difficulty.  The area was cleansed again with betadine and the Nexplanon was inserted per manufacturer's recommendations without difficulty.  Steri-strips and a pressure bandage was applied.  There was less than 3 cc blood loss. There were no complications.  The patient tolerated the procedure well.  She was instructed to keep the area clean and dry, remove pressure bandage in 24 hours, and keep insertion site covered with the steri-strips for 3-5 days.  She was given a card indicating date Nexplanon was inserted  and date it needs to be removed.   Follow-up PRN problems.  Marge Duncans CNM, [facility] 12/14/2015 10:13 AM

## 2015-12-14 NOTE — Patient Instructions (Signed)

## 2015-12-22 IMAGING — US US PELVIS COMPLETE
1 series · 13 of 25 positions shown · non-contrast
Comparison: None

ADDENDUM:
There is no intrauterine device, per the patient.
CLINICAL DATA: Diffuse pelvic pain since this morning, head severe
right lower quadrant pain 8 p.m. last night, has intrauterine device

EXAM:
TRANSABDOMINAL AND TRANSVAGINAL ULTRASOUND OF PELVIS
TECHNIQUE: Both transabdominal and transvaginal ultrasound examinations of the
pelvis were performed. Transabdominal technique was performed for
global imaging of the pelvis including uterus, ovaries, adnexal
regions, and pelvic cul-de-sac. It was necessary to proceed with
endovaginal exam following the transabdominal exam to visualize the
uterus endometrium and ovaries.

[Series 1: us pelvis complete · 0.25mm/px · 13 of 106 slices shown]
[im 1/106]
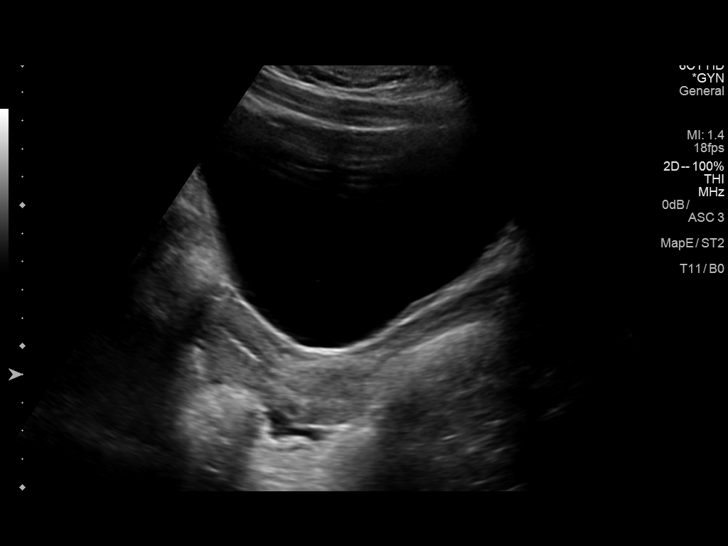
[im 9/106]
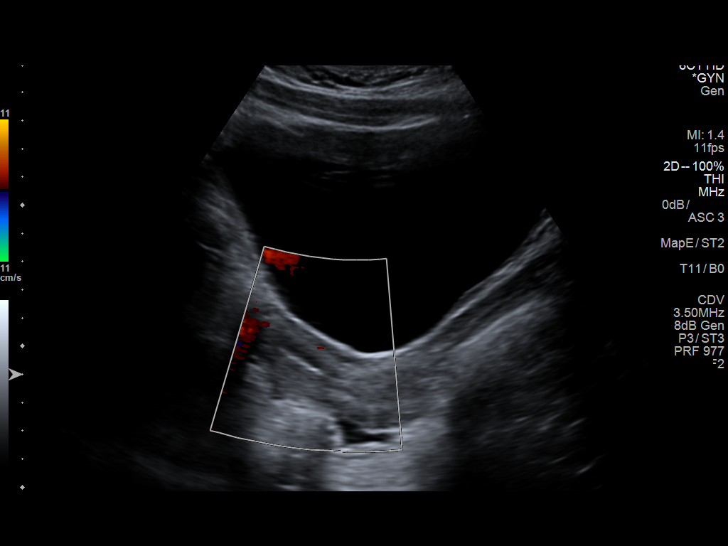
[im 18/106]
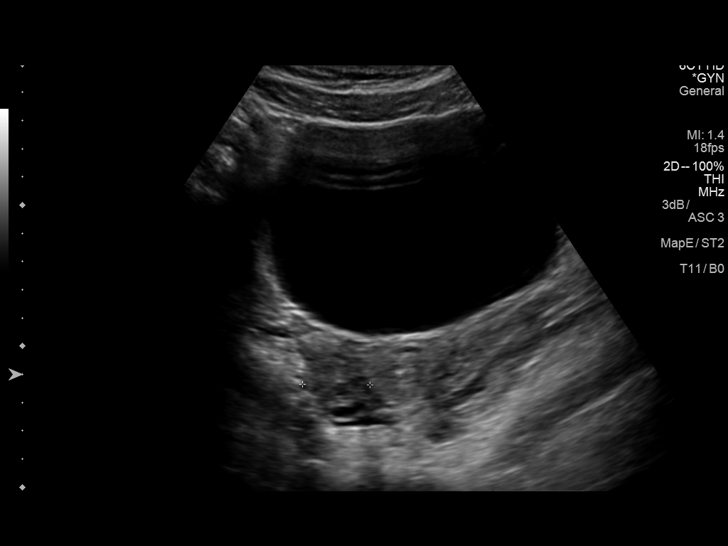
[im 27/106]
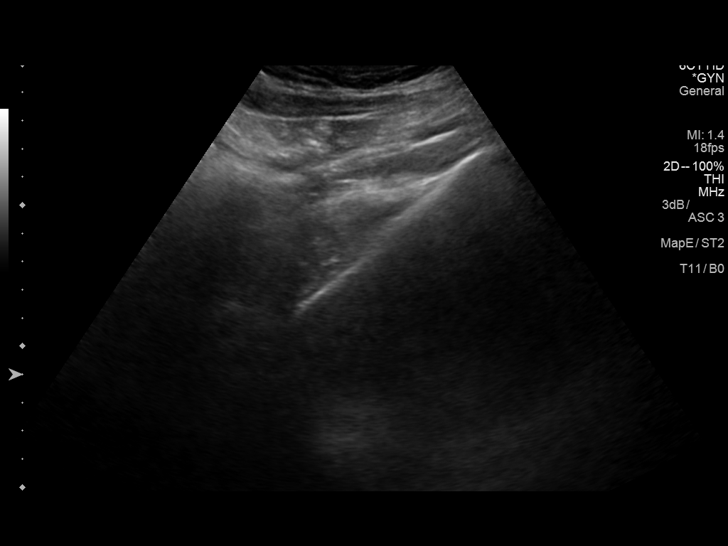
[im 36/106]
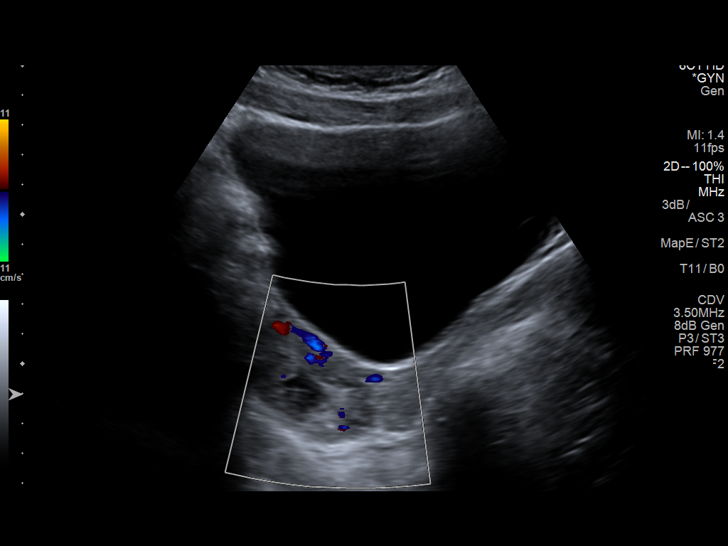
[im 44/106]
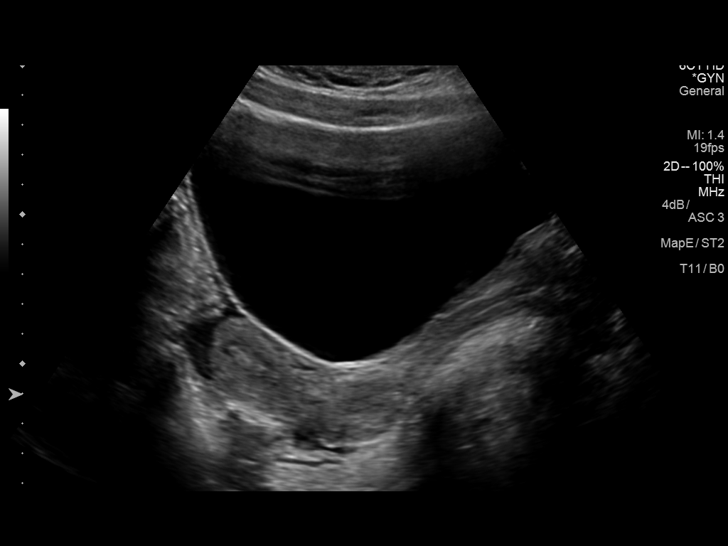
[im 53/106]
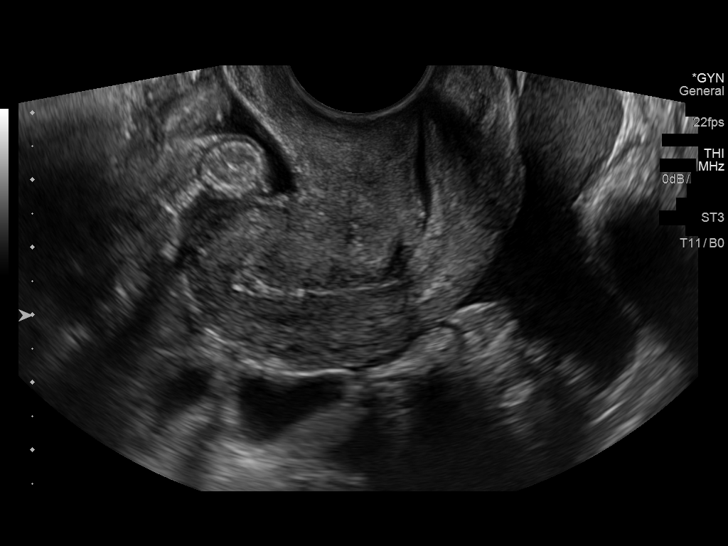
[im 62/106]
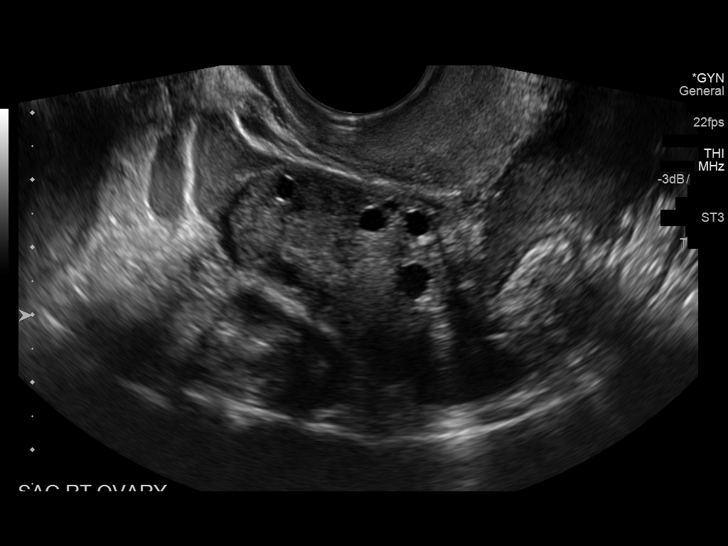
[im 71/106]
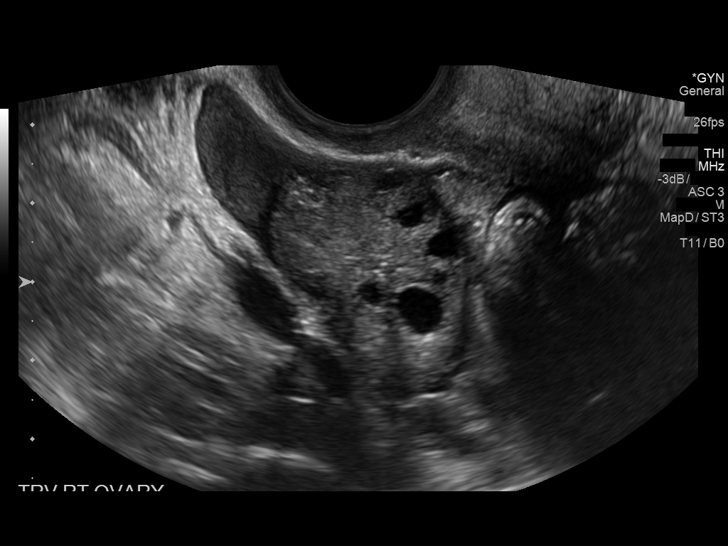
[im 79/106]
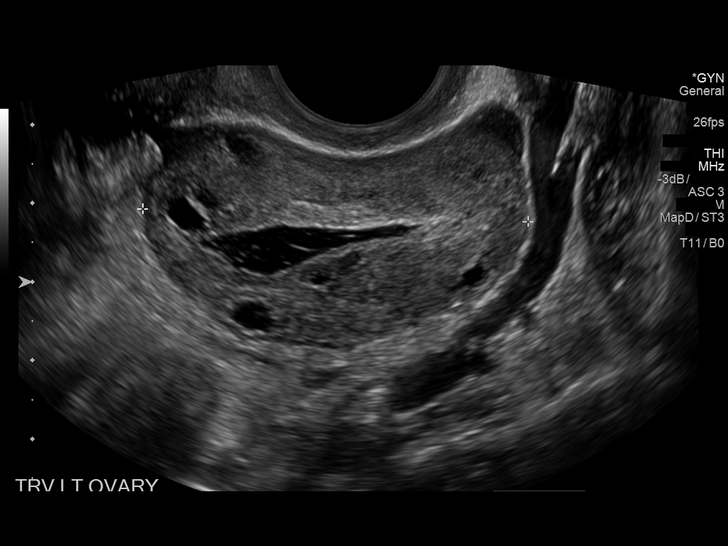
[im 88/106]
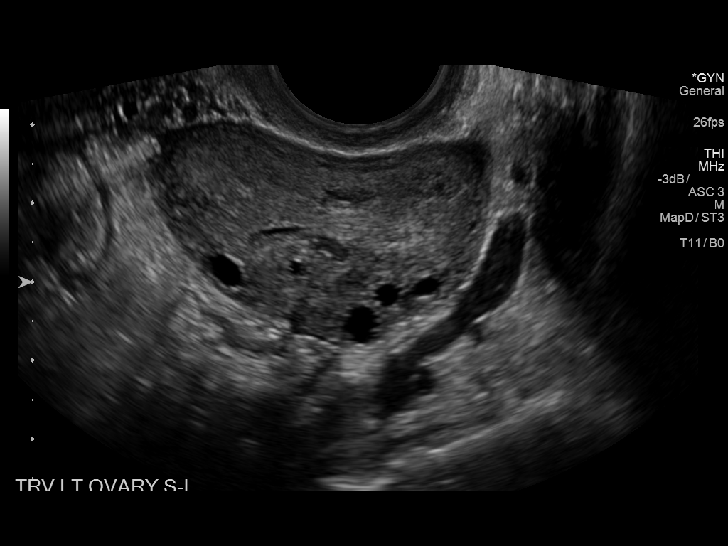
[im 97/106]
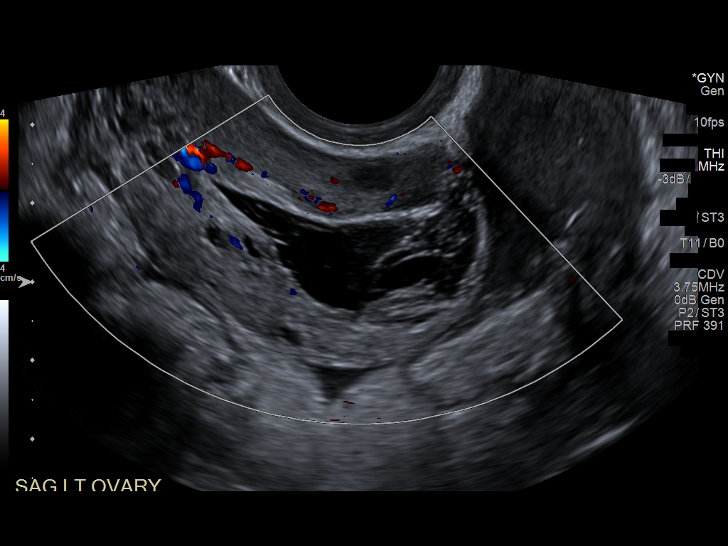
[im 106/106]
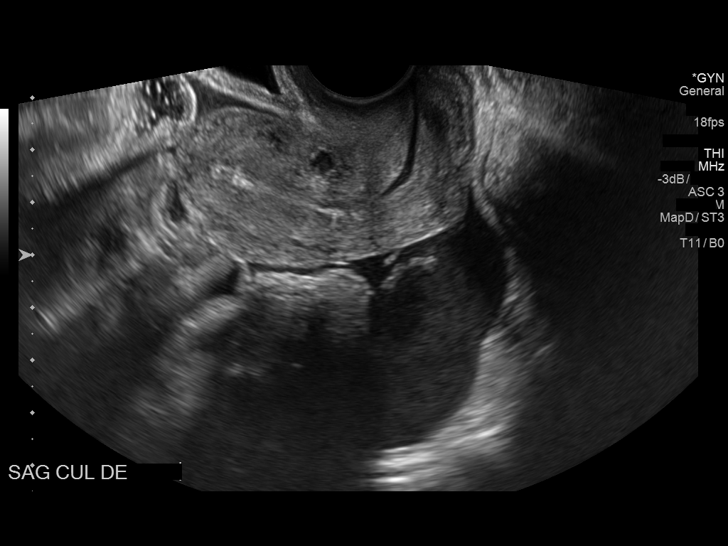

[13 of 25 positions shown; findings below may reference images not displayed]

FINDINGS: Uterus

Measurements: 7 x 3 x 3.9 cm. No fibroids or the identified. Subtle
echogenic stripe within the endometrium may represent intrauterine
device. It is not very well seen.

Endometrium

Thickness: 4 mm.  No focal abnormality visualized.

Right ovary

Measurements: 35 x 20 x 25 mm. Normal appearance/no adnexal mass.

Left ovary

Measurements: 58 x 19 x 49 mm. There is a complex cystic structure
measuring 34 x 17 x 28 mm with numerous internal septations.

Other findings

Moderate volume of echogenic free fluid in the pelvis and left
adnexa.
IMPRESSION: Findings suggest left ovarian hemorrhagic cyst associated with
complex free presumably hemorrhagic free fluid in the pelvis. Follow
up pelvic ultrasound in 6 - 12 weeks is recommended. This
recommendation follows the consensus statement: Management of
Asymptomatic Ovarian and Other Adnexal Cysts Imaged at [place]: Society
of Radiologists in Ultrasound Consensus Conference Statement.

## 2016-05-11 ENCOUNTER — Telehealth: Payer: Self-pay | Admitting: Pediatrics

## 2016-05-11 ENCOUNTER — Encounter: Payer: Self-pay | Admitting: Pediatrics

## 2016-05-11 ENCOUNTER — Other Ambulatory Visit: Payer: Self-pay | Admitting: Pediatrics

## 2016-05-11 ENCOUNTER — Ambulatory Visit (INDEPENDENT_AMBULATORY_CARE_PROVIDER_SITE_OTHER): Payer: BLUE CROSS/BLUE SHIELD | Admitting: Pediatrics

## 2016-05-11 VITALS — BP 111/69 | HR 66 | Temp 97.6°F | Ht 67.0 in | Wt 158.6 lb

## 2016-05-11 DIAGNOSIS — J029 Acute pharyngitis, unspecified: Secondary | ICD-10-CM

## 2016-05-11 DIAGNOSIS — J069 Acute upper respiratory infection, unspecified: Secondary | ICD-10-CM | POA: Diagnosis not present

## 2016-05-11 NOTE — Progress Notes (Signed)
    Subjective:    Patient ID: Valerie Baldwin, female    DOB: 07/19/1996, 20 y.o.   MRN: [id number]  CC: Sore Throat   HPI: Valerie Baldwin is a 20 y.o. female presenting for Sore Throat  Cold last week Throat was dry Then 2 days ago started having sore throat Coughing some but not a lot Felt a little warm yesterday   [facility] 05/11/2016 08/27/2015 05/15/2014  Decreased Interest 0 0 0  Down, Depressed, Hopeless 0 0 0  PHQ - 2 Score 0 0 0     Relevant past medical, surgical, family and social history reviewed and updated as indicated.  Interim medical history since our last visit reviewed. Allergies and medications reviewed and updated.  ROS: Per HPI unless specifically indicated above  History  Smoking status  . Never Smoker   Smokeless tobacco  . Not on file       Objective:    BP 111/69 mmHg  Pulse 66  Temp(Src) 97.6 F (36.4 C) (Oral)  Ht 5\' 7"  (1.702 m)  Wt 158 lb 9.6 oz (71.94 kg)  BMI 24.83 kg/m2  Wt Readings from Last 3 Encounters:  05/11/16 158 lb 9.6 oz (71.94 kg)  12/14/15 156 lb (70.761 kg)  11/16/15 158 lb (71.668 kg)     Gen: NAD, alert, cooperative with exam, NCAT EYES: EOMI, no scleral injection or icterus ENT:  TMs pearly gray b/l, OP with mild erythema, exudates present R tonsil LYMPH: no cervical LAD CV: NRRR, normal S1/S2, no murmur Resp: CTABL, no wheezes, normal WOB Abd: +BS, soft, NTND.  Ext: No edema, warm Neuro: Alert and oriented     Assessment & Plan:    Valerie Baldwin was seen today for sore throat, has exudates on R side. Neg rapid strep, will send for culture. Symptomatic care.  Diagnoses and all orders for this visit:  Sore throat -     [facility] ([facility]) -     Culture, Group A Strep  Acute URI   Follow up plan: Return if symptoms worsen or fail to improve.  Rex Krasarol Imani Fiebelkorn, MD [facility] 05/11/2016, 11:53 AM

## 2016-05-11 NOTE — Telephone Encounter (Signed)
She has a virus, she doesn't need an antibiotic unless the strep throat is positive. It was negative in clinic, we will wait for cultures, so she should continue to take tylenol, motrin as needed, throat losenges, and this should go away.

## 2016-05-11 NOTE — Telephone Encounter (Signed)
Pt aware of notes 

## 2016-05-12 LAB — CULTURE, GROUP A STREP

## 2016-05-12 LAB — RAPID STREP SCREEN (MED CTR MEBANE ONLY): Strep Gp A Ag, IA W/Reflex: NEGATIVE

## 2016-05-13 LAB — CULTURE, GROUP A STREP: Strep A Culture: NEGATIVE

## 2016-07-07 ENCOUNTER — Ambulatory Visit (INDEPENDENT_AMBULATORY_CARE_PROVIDER_SITE_OTHER): Payer: BLUE CROSS/BLUE SHIELD | Admitting: Physician Assistant

## 2016-07-07 ENCOUNTER — Encounter: Payer: Self-pay | Admitting: Physician Assistant

## 2016-07-07 VITALS — BP 103/68 | HR 67 | Temp 96.8°F | Ht 67.0 in | Wt 163.4 lb

## 2016-07-07 DIAGNOSIS — N926 Irregular menstruation, unspecified: Secondary | ICD-10-CM

## 2016-07-07 DIAGNOSIS — R5383 Other fatigue: Secondary | ICD-10-CM | POA: Diagnosis not present

## 2016-07-07 DIAGNOSIS — F411 Generalized anxiety disorder: Secondary | ICD-10-CM | POA: Diagnosis not present

## 2016-07-07 MED ORDER — SERTRALINE HCL 50 MG PO TABS: 50.0000 mg | ORAL_TABLET | Freq: Every day | ORAL | 0 refills | Status: DC

## 2016-07-07 NOTE — Progress Notes (Signed)
BP 103/68 (BP Location: Left Arm, Patient Position: Sitting, Cuff Size: Normal)   Pulse 67   Temp (!) 96.8 F (36 C) (Oral)   Ht '5\' 7"'  (1.702 m)   Wt 163 lb 6.4 oz (74.1 kg)   BMI 25.59 kg/m    Subjective:    Patient ID: Valerie Baldwin, female    DOB: 06-13-1996, 20 y.o.   MRN: [id number]  HPI: Valerie Baldwin is a 20 y.o. female presenting on 07/07/2016 for Fatigue; Herpes Zoster (Patient had shingles about 2 weeks ago and was advised to talk to someone about her anxiety.); and Anxiety  Very pleasant young lady with concern about her anxiety and worry being too overwhelming at this time.  Reports feeling tired all the time and irregular cycle even with her implanon.  She would like labs checked which is very reasonable. Also performed screeners that were negative for depression but positive for generalized anxiety. Sh did have shingles just as the semester was starting, was seen through a urgent care at the pharmacy. 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  Depression screen PHQ 2/9 07/07/2016  Decreased Interest 0  Down, Depressed, Hopeless 0  PHQ - 2 Score 0   GAD 7 : Generalized Anxiety Score 07/07/2016  Nervous, Anxious, on Edge 3  Control/stop worrying 3  Worry too much - different things 3  Trouble relaxing 3  Restless 1  Easily annoyed or irritable 2  Afraid - awful might happen 1  Total GAD 7 Score 16  Anxiety Difficulty Not difficult at all    Relevant past medical, surgical, family and social history reviewed and updated as indicated. Interim medical history since our last visit reviewed. Allergies and medications reviewed and updated. DATA REVIEWED: CHART IN EPIC  Review of Systems  Constitutional: Positive for fatigue. Negative for activity change, fever and unexpected weight change.  HENT: Negative.   Eyes: Negative.   Respiratory: Negative.  Negative for  cough, shortness of breath and wheezing.   Cardiovascular: Negative.  Negative for chest pain and palpitations.  Gastrointestinal: Negative.  Negative for abdominal pain.  Endocrine: Negative.   Genitourinary: Negative.  Negative for dysuria.  Musculoskeletal: Negative.   Skin: Negative.   Neurological: Negative.   Psychiatric/Behavioral: Positive for decreased concentration. Negative for dysphoric mood, hallucinations, self-injury, sleep disturbance and suicidal ideas. The patient is nervous/anxious.     Per HPI unless specifically indicated above    Medication List       Accurate as of 07/07/16  1:06 PM. Always use your most recent med list.          sertraline 50 MG tablet Commonly known as:  ZOLOFT Take 1 tablet (50 mg total) by mouth daily.          Objective:    BP 103/68 (BP Location: Left Arm, Patient Position: Sitting, Cuff Size: Normal)   Pulse 67   Temp (!) 96.8 F (36 C) (Oral)   Ht '5\' 7"'  (1.702 m)   Wt 163 lb 6.4 oz (74.1 kg)   BMI 25.59 kg/m   No Known Allergies  Wt Readings from Last 3 Encounters:  07/07/16 163 lb 6.4 oz (74.1 kg)  05/11/16 158 lb 9.6 oz (71.9 kg)  12/14/15 156 lb (70.8 kg)    Physical Exam  Constitutional: She is oriented to person, place, and time. She appears well-developed and well-nourished.  HENT:  Head: Normocephalic and atraumatic.  Eyes: Conjunctivae and EOM are normal. Pupils are equal, round, and reactive to light.  Neck: Normal range of motion. Neck supple.  Cardiovascular: Normal rate, regular rhythm, normal heart sounds and intact distal pulses.   Pulmonary/Chest: Effort normal and breath sounds normal.  Neurological: She is alert and oriented to person, place, and time. She has normal reflexes.  Skin: Skin is warm and dry. No rash noted.  Psychiatric: She has a normal mood and affect. Her behavior is normal. Judgment and thought content normal.       Assessment & Plan:   1. GAD (generalized anxiety  disorder) Information about stress management, recommend Psychology Today for resources, Search at [facility] for counseling services since she is a full time student. - sertraline (ZOLOFT) 50 MG tablet; Take 1 tablet (50 mg total) by mouth daily.  Dispense: 30 tablet; Refill: 0  2. Other fatigue - CMP14+EGFR - CBC with Differential/Platelet - TSH  3. Irregular menses - CMP14+EGFR - CBC with Differential/Platelet - TSH   Continue all other maintenance medications as listed above.  Follow up plan: Return in about 4 weeks (around 08/04/2016).   Orders Placed This Encounter  Procedures  . CMP14+EGFR  . CBC with Differential/Platelet  . TSH    Educational handout given for stress management  Terald Sleeper PA-C [facility] [address]  [place],  [address] [phone]   07/07/2016, 1:06 PM

## 2016-07-07 NOTE — Patient Instructions (Signed)
Stress and Stress Management Stress is a normal reaction to life events. It is what you feel when life demands more than you are used to or more than you can handle. Some stress can be useful. For example, the stress reaction can help you catch the last bus of the day, study for a test, or meet a deadline at work. But stress that occurs too often or for too long can cause problems. It can affect your emotional health and interfere with relationships and normal daily activities. Too much stress can weaken your immune system and increase your risk for physical illness. If you already have a medical problem, stress can make it worse. CAUSES  All sorts of life events may cause stress. An event that causes stress for one person may not be stressful for another person. Major life events commonly cause stress. These may be positive or negative. Examples include losing your job, moving into a new home, getting married, having a baby, or losing a loved one. Less obvious life events may also cause stress, especially if they occur day after day or in combination. Examples include working long hours, driving in traffic, caring for children, being in debt, or being in a difficult relationship. SIGNS AND SYMPTOMS Stress may cause emotional symptoms including, the following:  Anxiety. This is feeling worried, afraid, on edge, overwhelmed, or out of control.  Anger. This is feeling irritated or impatient.  Depression. This is feeling sad, down, helpless, or guilty.  Difficulty focusing, remembering, or making decisions. Stress may cause physical symptoms, including the following:   Aches and pains. These may affect your head, neck, back, stomach, or other areas of your body.  Tight muscles or clenched jaw.  Low energy or trouble sleeping. Stress may cause unhealthy behaviors, including the following:   Eating to feel better (overeating) or skipping meals.  Sleeping too little, too much, or both.  Working  too much or putting off tasks (procrastination).  Smoking, drinking alcohol, or using drugs to feel better. DIAGNOSIS  Stress is diagnosed through an assessment by your health care provider. Your health care provider will ask questions about your symptoms and any stressful life events.Your health care provider will also ask about your medical history and may order blood tests or other tests. Certain medical conditions and medicine can cause physical symptoms similar to stress. Mental illness can cause emotional symptoms and unhealthy behaviors similar to stress. Your health care provider may refer you to a mental health professional for further evaluation.  TREATMENT  Stress management is the recommended treatment for stress.The goals of stress management are reducing stressful life events and coping with stress in healthy ways.  Techniques for reducing stressful life events include the following:  Stress identification. Self-monitor for stress and identify what causes stress for you. These skills may help you to avoid some stressful events.  Time management. Set your priorities, keep a calendar of events, and learn to say "no." These tools can help you avoid making too many commitments. Techniques for coping with stress include the following:  Rethinking the problem. Try to think realistically about stressful events rather than ignoring them or overreacting. Try to find the positives in a stressful situation rather than focusing on the negatives.  Exercise. Physical exercise can release both physical and emotional tension. The key is to find a form of exercise you enjoy and do it regularly.  Relaxation techniques. These relax the body and mind. Examples include yoga, meditation, tai chi, biofeedback, deep  breathing, progressive muscle relaxation, listening to music, being out in nature, journaling, and other hobbies. Again, the key is to find one or more that you enjoy and can do  regularly.  Healthy lifestyle. Eat a balanced diet, get plenty of sleep, and do not smoke. Avoid using alcohol or drugs to relax.  Strong support network. Spend time with family, friends, or other people you enjoy being around.Express your feelings and talk things over with someone you trust. Counseling or talktherapy with a mental health professional may be helpful if you are having difficulty managing stress on your own. Medicine is typically not recommended for the treatment of stress.Talk to your health care provider if you think you need medicine for symptoms of stress. HOME CARE INSTRUCTIONS  Keep all follow-up visits as directed by your health care provider.  Take all medicines as directed by your health care provider. SEEK MEDICAL CARE IF:  Your symptoms get worse or you start having new symptoms.  You feel overwhelmed by your problems and can no longer manage them on your own. SEEK IMMEDIATE MEDICAL CARE IF:  You feel like hurting yourself or someone else.   This information is not intended to replace advice given to you by your health care provider. Make sure you discuss any questions you have with your health care provider.   Document Released: 04/05/2001 Document Revised: 10/31/2014 Document Reviewed: 06/04/2013 Elsevier Interactive Patient Education 2016 Elsevier Inc.  

## 2016-07-08 LAB — CBC WITH DIFFERENTIAL/PLATELET
BASOS: 0 %
Basophils Absolute: 0 10*3/uL (ref 0.0–0.2)
EOS (ABSOLUTE): 0.2 10*3/uL (ref 0.0–0.4)
Eos: 3 %
Hematocrit: 37.6 % (ref 34.0–46.6)
Hemoglobin: 12.5 g/dL (ref 11.1–15.9)
IMMATURE GRANS (ABS): 0 10*3/uL (ref 0.0–0.1)
IMMATURE GRANULOCYTES: 0 %
LYMPHS: 35 %
Lymphocytes Absolute: 2.5 10*3/uL (ref 0.7–3.1)
MCH: 30.7 pg (ref 26.6–33.0)
MCHC: 33.2 g/dL (ref 31.5–35.7)
MCV: 92 fL (ref 79–97)
Monocytes Absolute: 0.5 10*3/uL (ref 0.1–0.9)
Monocytes: 7 %
NEUTROS PCT: 55 %
Neutrophils Absolute: 4 10*3/uL (ref 1.4–7.0)
PLATELETS: 317 10*3/uL (ref 150–379)
RBC: 4.07 x10E6/uL (ref 3.77–5.28)
RDW: 13.6 % (ref 12.3–15.4)
WBC: 7.2 10*3/uL (ref 3.4–10.8)

## 2016-07-08 LAB — CMP14+EGFR
ALT: 10 IU/L (ref 0–32)
AST: 20 IU/L (ref 0–40)
Albumin/Globulin Ratio: 2 (ref 1.2–2.2)
Albumin: 4.6 g/dL (ref 3.5–5.5)
Alkaline Phosphatase: 56 IU/L (ref 39–117)
BUN/Creatinine Ratio: 17 (ref 9–23)
BUN: 12 mg/dL (ref 6–20)
Bilirubin Total: 0.3 mg/dL (ref 0.0–1.2)
CALCIUM: 9.5 mg/dL (ref 8.7–10.2)
CO2: 24 mmol/L (ref 18–29)
CREATININE: 0.7 mg/dL (ref 0.57–1.00)
Chloride: 100 mmol/L (ref 96–106)
GFR calc Af Amer: 142 mL/min/{1.73_m2} (ref >59)
GFR, EST NON AFRICAN AMERICAN: 123 mL/min/{1.73_m2} (ref >59)
GLUCOSE: 74 mg/dL (ref 65–99)
Globulin, Total: 2.3 g/dL (ref 1.5–4.5)
POTASSIUM: 4.5 mmol/L (ref 3.5–5.2)
Sodium: 139 mmol/L (ref 134–144)
Total Protein: 6.9 g/dL (ref 6.0–8.5)

## 2016-07-08 LAB — TSH: TSH: 1.62 u[IU]/mL (ref 0.450–4.500)

## 2016-08-02 ENCOUNTER — Ambulatory Visit (INDEPENDENT_AMBULATORY_CARE_PROVIDER_SITE_OTHER): Payer: BLUE CROSS/BLUE SHIELD | Admitting: Physician Assistant

## 2016-08-02 ENCOUNTER — Encounter: Payer: Self-pay | Admitting: Physician Assistant

## 2016-08-02 VITALS — BP 106/68 | HR 69 | Temp 97.9°F | Ht 67.0 in | Wt 164.8 lb

## 2016-08-02 DIAGNOSIS — F411 Generalized anxiety disorder: Secondary | ICD-10-CM

## 2016-08-02 DIAGNOSIS — R300 Dysuria: Secondary | ICD-10-CM

## 2016-08-02 LAB — URINALYSIS, COMPLETE
BILIRUBIN UA: NEGATIVE
GLUCOSE, UA: NEGATIVE
KETONES UA: NEGATIVE
Leukocytes, UA: NEGATIVE
NITRITE UA: NEGATIVE
PROTEIN UA: NEGATIVE
RBC UA: NEGATIVE
Specific Gravity, UA: <1.005 — ABNORMAL LOW (ref 1.005–1.030)
UUROB: 0.2 mg/dL (ref 0.2–1.0)
pH, UA: 6 (ref 5.0–7.5)

## 2016-08-02 LAB — MICROSCOPIC EXAMINATION: RBC MICROSCOPIC, UA: NONE SEEN /HPF (ref 0–?)

## 2016-08-02 MED ORDER — SULFAMETHOXAZOLE-TRIMETHOPRIM 800-160 MG PO TABS: 1.0000 | ORAL_TABLET | Freq: Two times a day (BID) | ORAL | 0 refills | Status: DC

## 2016-08-02 MED ORDER — SERTRALINE HCL 100 MG PO TABS: 100.0000 mg | ORAL_TABLET | Freq: Every day | ORAL | 11 refills | Status: DC

## 2016-08-02 NOTE — Patient Instructions (Signed)

## 2016-08-02 NOTE — Progress Notes (Signed)
BP 106/68   Pulse 69   Temp 97.9 F (36.6 C) (Oral)   Ht 5\' 7"  (1.702 m)   Wt 164 lb 12.8 oz (74.8 kg)   BMI 25.81 kg/m    Subjective:    Patient ID: Valerie Baldwin, female    DOB: 02/14/1996, 20 y.o.   MRN: [id number]  HPI: Valerie Baldwin is a 20 y.o. female presenting on 08/02/2016 for Depression and Anxiety  This patient comes in for recheck on her generalized anxiety and depression. She is taking the Zoloft for about a month now. She states that she has had some improvement in her ability to concentrate and focus on her tasks. Her family members have stated that she seems to be feeling better emotionally. She thinks that there could be a little bit of an improvement with the medication and so we will plan an increase. We will send this in for her and have her get started with that as soon as possible.  She is also having some dysuria for a few days. She is going out of town. She has had a history of UTIs in the past of a bike to have urine checked.   Relevant past medical, surgical, family and social history reviewed and updated as indicated. Interim medical history since our last visit reviewed. Allergies and medications reviewed and updated. DATA REVIEWED: CHART IN EPIC  Social History   Social History  . Marital status: Single    Spouse name: N/A  . Number of children: N/A  . Years of education: N/A   Occupational History  . Not on file.   Social History Main Topics  . Smoking status: Never Smoker  . Smokeless tobacco: Never Used  . Alcohol use Yes  . Drug use: No  . Sexual activity: Yes    Birth control/ protection: Implant   Other Topics Concern  . Not on file   Social History Narrative  . No narrative on file    Past Surgical History:  Procedure Laterality Date  . APPENDECTOMY    . WISDOM TOOTH EXTRACTION  2017    History reviewed. No pertinent family history.  Review of Systems  Constitutional: Negative.  Negative for activity change, fatigue  and fever.  HENT: Negative.   Eyes: Negative.   Respiratory: Negative.  Negative for cough.   Cardiovascular: Negative.  Negative for chest pain.  Gastrointestinal: Negative.  Negative for abdominal pain.  Endocrine: Negative.   Genitourinary: Positive for dysuria, frequency and urgency. Negative for flank pain.  Musculoskeletal: Negative.   Skin: Negative.   Neurological: Negative.   Psychiatric/Behavioral: Positive for decreased concentration. The patient is nervous/anxious.       Medication List       Accurate as of 08/02/16  9:13 PM. Always use your most recent med list.          sertraline 100 MG tablet Commonly known as:  ZOLOFT Take 1 tablet (100 mg total) by mouth daily.   sulfamethoxazole-trimethoprim 800-160 MG tablet Commonly known as:  BACTRIM DS Take 1 tablet by mouth 2 (two) times daily.          Objective:    BP 106/68   Pulse 69   Temp 97.9 F (36.6 C) (Oral)   Ht 5\' 7"  (1.702 m)   Wt 164 lb 12.8 oz (74.8 kg)   BMI 25.81 kg/m   No Known Allergies  Wt Readings from Last 3 Encounters:  08/02/16 164 lb 12.8 oz (74.8 kg)  07/07/16  163 lb 6.4 oz (74.1 kg)  05/11/16 158 lb 9.6 oz (71.9 kg)    Physical Exam  Results for orders placed or performed in visit on 08/02/16  Microscopic Examination  Result Value Ref Range   WBC, UA 0-5 0 - 5 /hpf   RBC, UA None seen 0 - 2 /hpf   Epithelial Cells (non renal) 0-10 0 - 10 /hpf   Bacteria, UA Few None seen/Few  Urinalysis, Complete  Result Value Ref Range   Specific Gravity, UA <1.005 (L) 1.005 - 1.030   pH, UA 6.0 5.0 - 7.5   Color, UA Yellow Yellow   Appearance Ur Clear Clear   Leukocytes, UA Negative Negative   Protein, UA Negative Negative/Trace   Glucose, UA Negative Negative   Ketones, UA Negative Negative   RBC, UA Negative Negative   Bilirubin, UA Negative Negative   Urobilinogen, Ur 0.2 0.2 - 1.0 mg/dL   Nitrite, UA Negative Negative   Microscopic Examination See below:         Assessment & Plan:   1. Generalized anxiety disorder - sertraline (ZOLOFT) 100 MG tablet; Take 1 tablet (100 mg total) by mouth daily.  Dispense: 30 tablet; Refill: 11  2. Dysuria Printed prescription for Bactrim DS 1 twice a day 7 days is given to the patient. Symptoms worsen while she is out of town, she is able to get medication started. Otherwise she is to continue with increased fluids and can use over-the-counter Pyridium - Urinalysis, Complete   Continue all other maintenance medications as listed above.  Follow up plan: Return if symptoms worsen or fail to improve.   Orders Placed This Encounter  Procedures  . Microscopic Examination  . Urinalysis, Complete    Educational handout given for dysuria.  Remus Loffler PA-C [facility] [address]  [place], [place] [address] [phone]   08/02/2016, 9:13 PM

## 2016-08-29 ENCOUNTER — Other Ambulatory Visit: Payer: Self-pay | Admitting: *Deleted

## 2016-08-29 DIAGNOSIS — F411 Generalized anxiety disorder: Secondary | ICD-10-CM

## 2016-08-29 MED ORDER — SERTRALINE HCL 100 MG PO TABS: 100.0000 mg | ORAL_TABLET | Freq: Every day | ORAL | 3 refills | Status: DC

## 2017-02-09 ENCOUNTER — Telehealth: Payer: Self-pay | Admitting: Physician Assistant

## 2017-02-10 NOTE — Telephone Encounter (Signed)
Attempted to fax letter to mother(lisa)  Letter would not go through.  Mother aware that patient should be able to pull up on MyChart.  If not letter will be placed at front desk for patient to pick up

## 2017-02-10 NOTE — Telephone Encounter (Signed)
Letter faxed to mother(Lisa) [phone]

## 2017-02-10 NOTE — Telephone Encounter (Signed)
Please advise concerning excuse note.

## 2017-09-04 ENCOUNTER — Encounter: Payer: Self-pay | Admitting: Nurse Practitioner

## 2017-09-04 ENCOUNTER — Ambulatory Visit (INDEPENDENT_AMBULATORY_CARE_PROVIDER_SITE_OTHER): Payer: BLUE CROSS/BLUE SHIELD | Admitting: Nurse Practitioner

## 2017-09-04 VITALS — BP 114/70 | HR 81 | Temp 98.4°F | Ht 67.0 in | Wt 178.0 lb

## 2017-09-04 DIAGNOSIS — N390 Urinary tract infection, site not specified: Secondary | ICD-10-CM | POA: Diagnosis not present

## 2017-09-04 DIAGNOSIS — B373 Candidiasis of vulva and vagina: Secondary | ICD-10-CM | POA: Diagnosis not present

## 2017-09-04 DIAGNOSIS — N3000 Acute cystitis without hematuria: Secondary | ICD-10-CM | POA: Diagnosis not present

## 2017-09-04 DIAGNOSIS — R300 Dysuria: Secondary | ICD-10-CM | POA: Diagnosis not present

## 2017-09-04 DIAGNOSIS — R319 Hematuria, unspecified: Secondary | ICD-10-CM | POA: Diagnosis not present

## 2017-09-04 LAB — URINALYSIS, COMPLETE
Bilirubin, UA: NEGATIVE
GLUCOSE, UA: NEGATIVE
KETONES UA: NEGATIVE
NITRITE UA: NEGATIVE
PROTEIN UA: NEGATIVE
Urobilinogen, Ur: 0.2 mg/dL (ref 0.2–1.0)
pH, UA: 6 (ref 5.0–7.5)

## 2017-09-04 LAB — MICROSCOPIC EXAMINATION
Renal Epithel, UA: NONE SEEN /hpf
WBC, UA: >30 /hpf — AB (ref 0–?)

## 2017-09-04 MED ORDER — NITROFURANTOIN MONOHYD MACRO 100 MG PO CAPS
100.0000 mg | ORAL_CAPSULE | Freq: Two times a day (BID) | ORAL | 0 refills | Status: DC
Start: 2017-09-04 — End: 2018-12-12

## 2017-09-04 NOTE — Patient Instructions (Signed)

## 2017-09-04 NOTE — Progress Notes (Signed)
Subjective:    Valerie Baldwin is a 21 y.o. female who complains of burning with urination, dysuria, foul smelling urine and frequency. She has had symptoms for 2 days. Patient also complains of back pain. Patient denies congestion, cough, fever and headache. Patient does have a history of recurrent UTI. Patient does not have a history of pyelonephritis.   The following portions of the patient's history were reviewed and updated as appropriate: allergies, current medications, past family history, past medical history, past social history, past surgical history and problem list.  Review of Systems Pertinent items noted in HPI and remainder of comprehensive ROS otherwise negative.    Objective:    BP 114/70   Pulse 81   Temp 98.4 F (36.9 C) (Oral)   Ht 5\' 7"  (1.702 m)   Wt 178 lb (80.7 kg)   BMI 27.88 kg/m  General appearance: alert and cooperative Lungs: clear to auscultation bilaterally Heart: regular rate and rhythm, S1, S2 normal, no murmur, click, rub or gallop Abdomen: soft, non-tender; bowel sounds normal; no masses,  no organomegaly and mild suprapubic pain on palpation, no CVA tenderness  Laboratory:  Urine dipstick: 2+ for leukocyte esterase.   Micro exam: >30 WBCs per HPF.    Assessment:    Acute cystitis     Plan:  Take medication as prescribe Cotton underwear Take shower not bath Cranberry juice, yogurt Force fluids AZO over the counter X2 days Culture pending RTO prn  Meds ordered this encounter  Medications  . nitrofurantoin, macrocrystal-monohydrate, (MACROBID) 100 MG capsule    Sig: Take 1 capsule (100 mg total) 2 (two) times daily by mouth. 1 po BId    Dispense:  14 capsule    Refill:  0    Order Specific Question:   Supervising Provider    Answer:   Johna SheriffVINCENT, CAROL L [[id number]]   Mary-Margaret Daphine DeutscherMartin, FNP

## 2017-09-06 LAB — URINE CULTURE

## 2018-01-08 ENCOUNTER — Ambulatory Visit: Payer: BLUE CROSS/BLUE SHIELD | Admitting: Pediatrics

## 2018-01-09 ENCOUNTER — Encounter: Payer: Self-pay | Admitting: Pediatrics

## 2018-04-11 ENCOUNTER — Telehealth: Payer: Self-pay | Admitting: Pediatrics

## 2018-04-11 NOTE — Telephone Encounter (Signed)
Pt aware and up front for patient to pick up

## 2018-12-12 ENCOUNTER — Ambulatory Visit (INDEPENDENT_AMBULATORY_CARE_PROVIDER_SITE_OTHER): Payer: 59 | Admitting: Women's Health

## 2018-12-12 ENCOUNTER — Encounter: Payer: Self-pay | Admitting: Women's Health

## 2018-12-12 VITALS — BP 114/72 | HR 99 | Ht 67.0 in | Wt 160.0 lb

## 2018-12-12 DIAGNOSIS — Z30017 Encounter for initial prescription of implantable subdermal contraceptive: Secondary | ICD-10-CM

## 2018-12-12 DIAGNOSIS — Z3049 Encounter for surveillance of other contraceptives: Secondary | ICD-10-CM | POA: Diagnosis not present

## 2018-12-12 DIAGNOSIS — Z3046 Encounter for surveillance of implantable subdermal contraceptive: Secondary | ICD-10-CM

## 2018-12-12 DIAGNOSIS — Z3202 Encounter for pregnancy test, result negative: Secondary | ICD-10-CM

## 2018-12-12 LAB — POCT URINE PREGNANCY: Preg Test, Ur: NEGATIVE

## 2018-12-12 MED ORDER — ETONOGESTREL 68 MG ~~LOC~~ IMPL
68.0000 mg | DRUG_IMPLANT | Freq: Once | SUBCUTANEOUS | Status: AC
  Administered 2018-12-12: 68 mg via SUBCUTANEOUS

## 2018-12-12 NOTE — Addendum Note (Signed)
Addended by: Colen Darling on: 12/12/2018 05:05 PM   Modules accepted: Orders

## 2018-12-12 NOTE — Progress Notes (Signed)
   NEXPLANON REMOVAL AND RE-INSERTION Patient name: Valerie Baldwin MRN [id number]  Date of birth: 02-14-1996 Subjective Findings:   Valerie Baldwin is a 23 y.o. G0P0000 Caucasian female being seen today for Nexplanon removal and re-insertion. Her Nexplanon was placed 12/14/15.   No LMP recorded. Patient has had an implant. Last pap1/23/17. Results were:  normal  Risks/benefits/side effects of Nexplanon have been discussed and her questions have been answered.  Specifically, a failure rate of 10/998 has been reported, with an increased failure rate if pt takes St. John's Wort and/or antiseizure medicaitons.  She is aware of the common side effect of irregular bleeding, which the incidence of decreases over time. Signed copy of informed consent in chart.  Pertinent History Reviewed:   Reviewed past medical,surgical, social, obstetrical and family history.  Reviewed problem list, medications and allergies. Objective Findings & Procedure:    Vitals:   12/12/18 1608  BP: 114/72  Pulse: 99  Weight: 160 lb (72.6 kg)  Height: 5\' 7"  (1.702 m)  Body mass index is 25.06 kg/m.  Results for orders placed or performed in visit on 12/12/18 (from the past 24 hour(s))  POCT urine pregnancy   Collection Time: 12/12/18  4:13 PM  Result Value Ref Range   Preg Test, Ur Negative Negative     Time out was performed.  Nexplanon site identified.  Area prepped in usual sterile fashon. Two cc's of 2% lidocaine was used to anesthetize the area. A small stab incision was made right beside the implant on the distal portion.  The Nexplanon rod was grasped using hemostats and removed intact without difficulty.  The area was cleansed again with betadine and the Nexplanon was inserted approximately 10cm from the medial epicondyle and 3-5cm posterior to the sulcus per manufacturer's recommendations without difficulty.  Steri-strips and a pressure bandage was applied.  There was less than 3 cc blood loss. There were no  complications.  The patient tolerated the procedure well. Assessment & Plan:   1) Nexplanon removal & re-insertion She was instructed to keep the area clean and dry, remove pressure bandage in 24 hours, and keep insertion site covered with the steri-strips for 3-5 days.  She was given a card indicating date Nexplanon was inserted and date it needs to be removed.  Follow-up PRN problems.  Orders Placed This Encounter  Procedures  . POCT urine pregnancy    Follow-up: Return in about 4 weeks (around 01/09/2019) for Pap & physical.  Cheral Marker CNM, [facility] 12/12/2018 4:52 PM

## 2018-12-12 NOTE — Patient Instructions (Signed)
Keep the area clean and dry.  You can remove the big bandage in 24 hours, and the small steri-strip bandage in 3-5 days.  A back up method, such as condoms, should be used for two weeks. You may have irregular vaginal bleeding for the first 6 months after the Nexplanon is placed, then the bleeding usually lightens and it is possible that you may not have any periods.  If you have any concerns, please give us a call.    Etonogestrel implant What is this medicine? ETONOGESTREL (et oh noe JES trel) is a contraceptive (birth control) device. It is used to prevent pregnancy. It can be used for up to 3 years. This medicine may be used for other purposes; ask your health care provider or pharmacist if you have questions. COMMON BRAND NAME(S): Implanon, Nexplanon What should I tell my health care provider before I take this medicine? They need to know if you have any of these conditions: -abnormal vaginal bleeding -blood vessel disease or blood clots -breast, cervical, endometrial, ovarian, liver, or uterine cancer -diabetes -gallbladder disease -heart disease or recent heart attack -high blood pressure -high cholesterol or triglycerides -kidney disease -liver disease -migraine headaches -seizures -stroke -tobacco smoker -an unusual or allergic reaction to etonogestrel, anesthetics or antiseptics, other medicines, foods, dyes, or preservatives -pregnant or trying to get pregnant -breast-feeding How should I use this medicine? This device is inserted just under the skin on the inner side of your upper arm by a health care professional. Talk to your pediatrician regarding the use of this medicine in children. Special care may be needed. Overdosage: If you think you have taken too much of this medicine contact a poison control center or emergency room at once. NOTE: This medicine is only for you. Do not share this medicine with others. What if I miss a dose? This does not apply. What may  interact with this medicine? Do not take this medicine with any of the following medications: -amprenavir -fosamprenavir This medicine may also interact with the following medications: -acitretin -aprepitant -armodafinil -bexarotene -bosentan -carbamazepine -certain medicines for fungal infections like fluconazole, ketoconazole, itraconazole and voriconazole -certain medicines to treat hepatitis, HIV or AIDS -cyclosporine -felbamate -griseofulvin -lamotrigine -modafinil -oxcarbazepine -phenobarbital -phenytoin -primidone -rifabutin -rifampin -rifapentine -St. John's wort -topiramate This list may not describe all possible interactions. Give your health care provider a list of all the medicines, herbs, non-prescription drugs, or dietary supplements you use. Also tell them if you smoke, drink alcohol, or use illegal drugs. Some items may interact with your medicine. What should I watch for while using this medicine? This product does not protect you against HIV infection (AIDS) or other sexually transmitted diseases. You should be able to feel the implant by pressing your fingertips over the skin where it was inserted. Contact your doctor if you cannot feel the implant, and use a non-hormonal birth control method (such as condoms) until your doctor confirms that the implant is in place. Contact your doctor if you think that the implant may have broken or become bent while in your arm. You will receive a user card from your health care provider after the implant is inserted. The card is a record of the location of the implant in your upper arm and when it should be removed. Keep this card with your health records. What side effects may I notice from receiving this medicine? Side effects that you should report to your doctor or health care professional as soon as possible: -allergic   reactions like skin rash, itching or hives, swelling of the face, lips, or tongue -breast lumps, breast  tissue changes, or discharge -breathing problems -changes in emotions or moods -if you feel that the implant may have broken or bent while in your arm -high blood pressure -pain, irritation, swelling, or bruising at the insertion site -scar at site of insertion -signs of infection at the insertion site such as fever, and skin redness, pain or discharge -signs and symptoms of a blood clot such as breathing problems; changes in vision; chest pain; severe, sudden headache; pain, swelling, warmth in the leg; trouble speaking; sudden numbness or weakness of the face, arm or leg -signs and symptoms of liver injury like dark yellow or brown urine; general ill feeling or flu-like symptoms; light-colored stools; loss of appetite; nausea; right upper belly pain; unusually weak or tired; yellowing of the eyes or skin -unusual vaginal bleeding, discharge Side effects that usually do not require medical attention (report to your doctor or health care professional if they continue or are bothersome): -acne -breast pain or tenderness -headache -irregular menstrual bleeding -nausea This list may not describe all possible side effects. Call your doctor for medical advice about side effects. You may report side effects to FDA at 1-800-FDA-1088. Where should I keep my medicine? This drug is given in a hospital or clinic and will not be stored at home. NOTE: This sheet is a summary. It may not cover all possible information. If you have questions about this medicine, talk to your doctor, pharmacist, or health care provider.  2019 Elsevier/Gold Standard (2017-08-29 14:11:42)  

## 2019-01-17 ENCOUNTER — Other Ambulatory Visit: Payer: Self-pay | Admitting: Women's Health

## 2019-08-16 DIAGNOSIS — F900 Attention-deficit hyperactivity disorder, predominantly inattentive type: Secondary | ICD-10-CM | POA: Diagnosis not present

## 2022-04-05 ENCOUNTER — Encounter: Payer: Self-pay | Admitting: Women's Health

## 2022-04-05 ENCOUNTER — Ambulatory Visit (INDEPENDENT_AMBULATORY_CARE_PROVIDER_SITE_OTHER): Payer: Self-pay | Admitting: Women's Health

## 2022-04-05 VITALS — BP 129/79 | HR 98 | Ht 67.0 in | Wt 168.0 lb

## 2022-04-05 DIAGNOSIS — Z3046 Encounter for surveillance of implantable subdermal contraceptive: Secondary | ICD-10-CM

## 2022-04-05 DIAGNOSIS — Z3202 Encounter for pregnancy test, result negative: Secondary | ICD-10-CM

## 2022-04-05 DIAGNOSIS — Z30017 Encounter for initial prescription of implantable subdermal contraceptive: Secondary | ICD-10-CM

## 2022-04-05 LAB — POCT URINE PREGNANCY: Preg Test, Ur: NEGATIVE

## 2022-04-05 MED ORDER — ETONOGESTREL 68 MG ~~LOC~~ IMPL
68.0000 mg | DRUG_IMPLANT | Freq: Once | SUBCUTANEOUS | Status: AC
  Administered 2022-04-05: 68 mg via SUBCUTANEOUS

## 2022-04-05 NOTE — Progress Notes (Signed)
   NEXPLANON REMOVAL AND RE-INSERTION Patient name: Valerie Baldwin MRN [id number]  Date of birth: 1996/08/11 Subjective Findings:   Valerie Baldwin is a 26 y.o. G0P0000 Caucasian female being seen today for Nexplanon removal and re-insertion. Her Nexplanon was placed 12/12/18.   No LMP recorded. Patient has had an implant. Last pap 2021. Results were: negative per pt report at PCP in [place]  Risks/benefits/side effects of Nexplanon have been discussed and her questions have been answered.  Specifically, a failure rate of 10/998 has been reported, with an increased failure rate if pt takes St. John's Wort and/or antiseizure medicaitons.  She is aware of the common side effect of irregular bleeding, which the incidence of decreases over time. Signed copy of informed consent in chart.      09/04/2017    3:05 PM 08/02/2016   10:26 AM 07/07/2016   10:16 AM 05/11/2016   11:33 AM 08/27/2015    4:59 PM  Depression screen PHQ 2/9  Decreased Interest 0 0 0 0 0  Down, Depressed, Hopeless 0 0 0 0 0  PHQ - 2 Score 0 0 0 0 0        07/07/2016   10:29 AM  GAD 7 : Generalized Anxiety Score  Nervous, Anxious, on Edge 3  Control/stop worrying 3  Worry too much - different things 3  Trouble relaxing 3  Restless 1  Easily annoyed or irritable 2  Afraid - awful might happen 1  Total GAD 7 Score 16  Anxiety Difficulty Not difficult at all     Pertinent History Reviewed:   Reviewed past medical,surgical, social, obstetrical and family history.  Reviewed problem list, medications and allergies. Objective Findings & Procedure:    Vitals:   04/05/22 1017  BP: 129/79  Pulse: 98  Weight: 168 lb (76.2 kg)  Height: 5\' 7"  (1.702 m)  Body mass index is 26.31 kg/m.  Results for orders placed or performed in visit on 04/05/22 (from the past 24 hour(s))  POCT urine pregnancy   Collection Time: 04/05/22 10:24 AM  Result Value Ref Range   Preg Test, Ur Negative Negative     Time out was  performed.  Nexplanon site identified.  Area prepped in usual sterile fashon. Two cc's of 2% lidocaine was used to anesthetize the area. A small stab incision was made right beside the implant on the distal portion.  The Nexplanon rod was grasped using hemostats and removed intact without difficulty.  The area was cleansed again with betadine and the Nexplanon was inserted approximately 10cm from the medial epicondyle and 3-5cm posterior to the sulcus per manufacturer's recommendations without difficulty.  Steri-strips and a pressure bandage was applied.  There was less than 3 cc blood loss. There were no complications.  The patient tolerated the procedure well. Assessment & Plan:   1) Nexplanon removal & re-insertion She was instructed to keep the area clean and dry, remove pressure bandage in 24 hours, and keep insertion site covered with the steri-strips for 3-5 days.  She was given a card indicating date Nexplanon was inserted and date it needs to be removed.  Follow-up PRN problems.  Orders Placed This Encounter  Procedures   POCT urine pregnancy    Follow-up: Return for prn.  04/07/22 CNM, [facility] 04/05/2022 10:46 AM

## 2022-04-05 NOTE — Patient Instructions (Signed)
Keep the area clean and dry.  You can remove the big bandage in 24 hours, and the small steri-strip bandage in 3-5 days.  A back up method, such as condoms, should be used for two weeks. You may have irregular vaginal bleeding for the first 6 months after the Nexplanon is placed, then the bleeding usually lightens and it is possible that you may not have any periods.  If you have any concerns, please give [place] a call.    Etonogestrel Implant What is this medication? ETONOGESTREL (et oh noe JES trel) prevents ovulation and pregnancy. It belongs to a group of medications called contraceptives. This medication is a progestin hormone. This medicine may be used for other purposes; ask your health care provider or pharmacist if you have questions. COMMON BRAND NAME(S): Implanon, Nexplanon What should I tell my care team before I take this medication? They need to know if you have any of these conditions: Abnormal vaginal bleeding Blood vessel disease or blood clots Breast, cervical, endometrial, ovarian, liver, or uterine cancer Diabetes Gallbladder disease Heart disease or recent heart attack High blood pressure High cholesterol or triglycerides Kidney disease Liver disease Migraine headaches Seizures Stroke Tobacco smoker An unusual or allergic reaction to etonogestrel, anesthetics or antiseptics, other medications, foods, dyes, or preservatives Pregnant or trying to get pregnant Breast-feeding How should I use this medication? This device is inserted just under the skin on the inner side of your upper arm by your care team. Talk to your care team about the use of this medication in children. Special care may be needed. Overdosage: If you think you have taken too much of this medicine contact a poison control center or emergency room at once. NOTE: This medicine is only for you. Do not share this medicine with others. What if I miss a dose? This does not apply. What may interact with this  medication? Do not take this medication with any of the following: Amprenavir Fosamprenavir This medication may also interact with the following: Acitretin Aprepitant Armodafinil Bexarotene Bosentan Carbamazepine Certain medications for fungal infections like fluconazole, ketoconazole, itraconazole and voriconazole Certain medications to treat hepatitis, HIV or AIDS Cyclosporine Felbamate Griseofulvin Lamotrigine Modafinil Oxcarbazepine Phenobarbital Phenytoin Primidone Rifabutin Rifampin Rifapentine St. John's wort Topiramate This list may not describe all possible interactions. Give your health care provider a list of all the medicines, herbs, non-prescription drugs, or dietary supplements you use. Also tell them if you smoke, drink alcohol, or use illegal drugs. Some items may interact with your medicine. What should I watch for while using this medication? This product does not protect you against HIV infection (AIDS) or other sexually transmitted diseases. You should be able to feel the implant by pressing your fingertips over the skin where it was inserted. Contact your care team if you cannot feel the implant, and use a non-hormonal birth control method (such as condoms) until your care team confirms that the implant is in place. Contact your care team if you think that the implant may have broken or become bent while in your arm. You will receive a user card from your care team after the implant is inserted. The card is a record of the location of the implant in your upper arm and when it should be removed. Keep this card with your health records. What side effects may I notice from receiving this medication? Side effects that you should report to your care team as soon as possible: Allergic reactions--skin rash, itching, hives, swelling of  the face, lips, tongue, or throat Blood clot--pain, swelling, or warmth in the leg, shortness of breath, chest pain Gallbladder  problems--severe stomach pain, nausea, vomiting, fever Increase in blood pressure Liver injury--right upper belly pain, loss of appetite, nausea, light-colored stool, dark yellow or brown urine, yellowing skin or eyes, unusual weakness or fatigue New or worsening migraines or headaches Pain, redness, or irritation at injection site Stroke--sudden numbness or weakness of the face, arm, or leg, trouble speaking, confusion, trouble walking, loss of balance or coordination, dizziness, severe headache, change in vision Unusual vaginal discharge, itching, or odor Worsening mood, feelings of depression Side effects that usually do not require medical attention (report to your care team if they continue or are bothersome): Breast pain or tenderness Dark patches of skin on the face or other sun-exposed areas Irregular menstrual cycles or spotting Nausea Weight gain This list may not describe all possible side effects. Call your doctor for medical advice about side effects. You may report side effects to FDA at 1-800-FDA-1088. Where should I keep my medication? This medication is given in a hospital or clinic and will not be stored at home. NOTE: This sheet is a summary. It may not cover all possible information. If you have questions about this medicine, talk to your doctor, pharmacist, or health care provider.  2023 Elsevier/Gold Standard (2021-09-10 00:00:00)  

## 2022-08-24 DIAGNOSIS — F900 Attention-deficit hyperactivity disorder, predominantly inattentive type: Secondary | ICD-10-CM | POA: Diagnosis not present

## 2022-08-24 DIAGNOSIS — F419 Anxiety disorder, unspecified: Secondary | ICD-10-CM | POA: Diagnosis not present

## 2022-11-17 DIAGNOSIS — J029 Acute pharyngitis, unspecified: Secondary | ICD-10-CM | POA: Diagnosis not present

## 2023-02-22 DIAGNOSIS — F900 Attention-deficit hyperactivity disorder, predominantly inattentive type: Secondary | ICD-10-CM | POA: Diagnosis not present

## 2023-02-22 DIAGNOSIS — F419 Anxiety disorder, unspecified: Secondary | ICD-10-CM | POA: Diagnosis not present

## 2023-08-22 DIAGNOSIS — F900 Attention-deficit hyperactivity disorder, predominantly inattentive type: Secondary | ICD-10-CM | POA: Diagnosis not present

## 2024-02-20 DIAGNOSIS — F900 Attention-deficit hyperactivity disorder, predominantly inattentive type: Secondary | ICD-10-CM | POA: Diagnosis not present

## 2024-02-20 DIAGNOSIS — F419 Anxiety disorder, unspecified: Secondary | ICD-10-CM | POA: Diagnosis not present

## 2024-05-10 ENCOUNTER — Encounter: Payer: Self-pay | Admitting: Advanced Practice Midwife

## 2024-09-05 DIAGNOSIS — F909 Attention-deficit hyperactivity disorder, unspecified type: Secondary | ICD-10-CM | POA: Diagnosis not present

## 2024-09-05 DIAGNOSIS — F419 Anxiety disorder, unspecified: Secondary | ICD-10-CM | POA: Diagnosis not present

## 2024-09-05 DIAGNOSIS — Z6829 Body mass index (BMI) 29.0-29.9, adult: Secondary | ICD-10-CM | POA: Diagnosis not present
